# Patient Record
Sex: Female | Born: 1955 | Race: Black or African American | Hispanic: No | Smoking: Never smoker
Health system: Southern US, Community
[De-identification: ages and names within clinical notes are randomized; demographics above are authoritative.]

## PROBLEM LIST (undated history)

## (undated) DIAGNOSIS — I10 Essential (primary) hypertension: Secondary | ICD-10-CM

## (undated) DIAGNOSIS — C801 Malignant (primary) neoplasm, unspecified: Secondary | ICD-10-CM

## (undated) DIAGNOSIS — E119 Type 2 diabetes mellitus without complications: Secondary | ICD-10-CM

## (undated) HISTORY — PX: MASTECTOMY: SHX3

## (undated) HISTORY — PX: CYST EXCISION: SHX5701

---

## 2010-05-29 ENCOUNTER — Ambulatory Visit: Payer: Self-pay | Admitting: Radiology

## 2010-05-29 ENCOUNTER — Ambulatory Visit (HOSPITAL_BASED_OUTPATIENT_CLINIC_OR_DEPARTMENT_OTHER): Admission: RE | Admit: 2010-05-29 | Discharge: 2010-05-29 | Payer: Self-pay | Admitting: Unknown Physician Specialty

## 2012-09-07 ENCOUNTER — Emergency Department (HOSPITAL_BASED_OUTPATIENT_CLINIC_OR_DEPARTMENT_OTHER)
Admission: EM | Admit: 2012-09-07 | Discharge: 2012-09-07 | Disposition: A | Discharge status: Home / Self Care | Payer: Self-pay | Attending: Emergency Medicine | Admitting: Emergency Medicine

## 2012-09-07 ENCOUNTER — Encounter (HOSPITAL_BASED_OUTPATIENT_CLINIC_OR_DEPARTMENT_OTHER): Payer: Self-pay | Admitting: Emergency Medicine

## 2012-09-07 DIAGNOSIS — I10 Essential (primary) hypertension: Secondary | ICD-10-CM | POA: Insufficient documentation

## 2012-09-07 DIAGNOSIS — E119 Type 2 diabetes mellitus without complications: Secondary | ICD-10-CM | POA: Insufficient documentation

## 2012-09-07 DIAGNOSIS — Z791 Long term (current) use of non-steroidal anti-inflammatories (NSAID): Secondary | ICD-10-CM | POA: Insufficient documentation

## 2012-09-07 DIAGNOSIS — Z79899 Other long term (current) drug therapy: Secondary | ICD-10-CM | POA: Insufficient documentation

## 2012-09-07 DIAGNOSIS — M26629 Arthralgia of temporomandibular joint, unspecified side: Secondary | ICD-10-CM | POA: Insufficient documentation

## 2012-09-07 HISTORY — DX: Type 2 diabetes mellitus without complications: E11.9

## 2012-09-07 HISTORY — DX: Essential (primary) hypertension: I10

## 2012-09-07 MED ORDER — NAPROXEN 500 MG PO TABS: 500.0000 mg | ORAL_TABLET | Freq: Two times a day (BID) | ORAL | Status: DC

## 2012-09-07 MED ORDER — NAPROXEN 250 MG PO TABS
500.0000 mg | ORAL_TABLET | Freq: Once | ORAL | Status: AC
  Administered 2012-09-07: 500 mg via ORAL
  Filled 2012-09-07: qty 2

## 2012-09-07 NOTE — ED Notes (Signed)
Pt reports swelling to left posterior jaw on Saturday, denies pain except when chewing, no sob, no difficulty swallowing

## 2012-09-07 NOTE — ED Provider Notes (Signed)
History    CSN: [id number] Arrival date & time 09/07/12  2132 First MD Initiated Contact with Patient 09/07/12 2158      Chief Complaint  Patient presents with  . Jaw Pain   HPI Patient started having pain behind her left jaw on Saturday. The pain is mild but increases with chewing. She has not had any earache or toothache. She denies any sore throat or difficulty breathing. She thought she felt some swelling as well in the left TMJ region. She came to the emergency room because the symptoms persisted. She was trying to get a time to see if it would go away on its own. Past Medical History  Diagnosis Date  . Hypertension   . Diabetes mellitus without complication     History reviewed. No pertinent past surgical history.  History reviewed. No pertinent family history.  History  Substance Use Topics  . Smoking status: Not on file  . Smokeless tobacco: Not on file  . Alcohol Use: No    OB History    Grav Para Term Preterm Abortions TAB SAB Ect Mult Living                  Review of Systems  Allergies  Review of patient's allergies indicates no known allergies.  Home Medications   Current Outpatient Rx  Name  Route  Sig  Dispense  Refill  . GLIPIZIDE 10 MG PO TABS   Oral   Take 10 mg by mouth 2 (two) times daily before a meal.         . HYDROCHLOROTHIAZIDE 25 MG PO TABS   Oral   Take 25 mg by mouth daily.         Marland Kitchen LISINOPRIL 40 MG PO TABS   Oral   Take 40 mg by mouth daily.         Marland Kitchen METFORMIN HCL 500 MG PO TABS   Oral   Take 500 mg by mouth 2 (two) times daily with a meal.         . METOPROLOL SUCCINATE ER 50 MG PO TB24   Oral   Take 50 mg by mouth daily. Take with or immediately following a meal.         . NAPROXEN 500 MG PO TABS   Oral   Take 1 tablet (500 mg total) by mouth 2 (two) times daily.   30 tablet   0     BP 173/74  Pulse 70  Temp 98.5 F (36.9 C) (Oral)  Resp 18  Ht 5\' 3"  (1.6 m)  Wt 190 lb (86.183 kg)  BMI 33.66 kg/m2   SpO2 97%  Physical Exam  Nursing note and vitals reviewed. Constitutional: She appears well-developed and well-nourished. No distress.  HENT:  Head: Normocephalic and atraumatic. No trismus in the jaw.  Right Ear: External ear normal.  Left Ear: External ear normal.  Mouth/Throat: Oropharynx is clear and moist. No oral lesions. No dental abscesses, uvula swelling or lacerations. No oropharyngeal exudate.       Absent teeth left posterior mandibular region, no erythema or masses, mild tenderness palpation left TMJ  Eyes: Conjunctivae normal are normal. Right eye exhibits no discharge. Left eye exhibits no discharge. No scleral icterus.  Neck: Neck supple. No tracheal deviation present.  Cardiovascular: Normal rate.   Pulmonary/Chest: Effort normal. No stridor. No respiratory distress.  Musculoskeletal: She exhibits no edema.  Lymphadenopathy:       Head (right side): No submental, no submandibular,  no tonsillar, no preauricular and no posterior auricular adenopathy present.       Head (left side): No submental, no submandibular, no tonsillar, no preauricular and no posterior auricular adenopathy present.    She has no cervical adenopathy.  Neurological: She is alert. Cranial nerve deficit: no gross deficits.  Skin: Skin is warm and dry. No rash noted.  Psychiatric: She has a normal mood and affect.    ED Course  Procedures (including critical care time)  Labs Reviewed - No data to display No results found.   1. TMJ arthralgia       MDM  No sign of infection or mass. 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 I will prescribe Naprosyn and have her followup with a dentist as needed        Celene Kras, MD 09/07/12 2213

## 2012-12-19 ENCOUNTER — Encounter (HOSPITAL_BASED_OUTPATIENT_CLINIC_OR_DEPARTMENT_OTHER): Payer: Self-pay | Admitting: Emergency Medicine

## 2012-12-19 ENCOUNTER — Emergency Department (HOSPITAL_BASED_OUTPATIENT_CLINIC_OR_DEPARTMENT_OTHER)
Admission: EM | Admit: 2012-12-19 | Discharge: 2012-12-19 | Disposition: A | Discharge status: Home / Self Care | Payer: Self-pay | Attending: Emergency Medicine | Admitting: Emergency Medicine

## 2012-12-19 DIAGNOSIS — M5432 Sciatica, left side: Secondary | ICD-10-CM

## 2012-12-19 DIAGNOSIS — M5430 Sciatica, unspecified side: Secondary | ICD-10-CM | POA: Insufficient documentation

## 2012-12-19 DIAGNOSIS — E119 Type 2 diabetes mellitus without complications: Secondary | ICD-10-CM | POA: Insufficient documentation

## 2012-12-19 DIAGNOSIS — Z79899 Other long term (current) drug therapy: Secondary | ICD-10-CM | POA: Insufficient documentation

## 2012-12-19 DIAGNOSIS — I10 Essential (primary) hypertension: Secondary | ICD-10-CM | POA: Insufficient documentation

## 2012-12-19 MED ORDER — TRAMADOL HCL 50 MG PO TABS
50.0000 mg | ORAL_TABLET | Freq: Four times a day (QID) | ORAL | Status: DC | PRN
Start: 2012-12-19 — End: 2014-10-31

## 2012-12-19 MED ORDER — KETOROLAC TROMETHAMINE 60 MG/2ML IM SOLN
60.0000 mg | Freq: Once | INTRAMUSCULAR | Status: AC
  Administered 2012-12-19: 60 mg via INTRAMUSCULAR
  Filled 2012-12-19: qty 2

## 2012-12-19 MED ORDER — CYCLOBENZAPRINE HCL 10 MG PO TABS: 10.0000 mg | ORAL_TABLET | Freq: Two times a day (BID) | ORAL | Status: DC | PRN

## 2012-12-19 NOTE — ED Notes (Signed)
Pt states lower left back pain started about 1 week ago, with no known injury, radiating down left leg.

## 2012-12-19 NOTE — ED Provider Notes (Signed)
History     CSN: [id number]  Arrival date & time 12/19/12  4098   First MD Initiated Contact with Patient 12/19/12 1002      Chief Complaint  Patient presents with  . Back Pain    (Consider location/radiation/quality/duration/timing/severity/associated sxs/prior treatment) HPI Comments: Patient comes to the ER for evaluation of low back pain. Pain began about one week ago. It is sharp and located in the left lower back. She reports that it is much worse if she bends over. She can find some positions are more comfortable and others. Pain has begun to radiate down the back or leg to the knee area. This is worse with bending as well. Patient denies any recent injury. She had a similar episode approximately 20 years ago, but has never had a problem with it until this week. Patient has not had any change in bowel or bladder function.  Patient is a 57 y.o. female presenting with back pain.  Back Pain   Past Medical History  Diagnosis Date  . Hypertension   . Diabetes mellitus without complication     History reviewed. No pertinent past surgical history.  History reviewed. No pertinent family history.  History  Substance Use Topics  . Smoking status: Not on file  . Smokeless tobacco: Not on file  . Alcohol Use: No    OB History   Grav Para Term Preterm Abortions TAB SAB Ect Mult Living                  Review of Systems  Genitourinary: Negative.   Musculoskeletal: Positive for back pain.    Allergies  Review of patient's allergies indicates no known allergies.  Home Medications   Current Outpatient Rx  Name  Route  Sig  Dispense  Refill  . glipiZIDE (GLUCOTROL) 10 MG tablet   Oral   Take 5 mg by mouth daily.          . hydrochlorothiazide (HYDRODIURIL) 25 MG tablet   Oral   Take 25 mg by mouth daily.         Marland Kitchen lisinopril (PRINIVIL,ZESTRIL) 40 MG tablet   Oral   Take 40 mg by mouth daily.         . metFORMIN (GLUCOPHAGE) 500 MG tablet   Oral   Take  1,000 mg by mouth 2 (two) times daily with a meal.          . metoprolol succinate (TOPROL-XL) 50 MG 24 hr tablet   Oral   Take 200 mg by mouth daily. Take with or immediately following a meal.         . naproxen (NAPROSYN) 500 MG tablet   Oral   Take 1 tablet (500 mg total) by mouth 2 (two) times daily.   30 tablet   0     BP 183/93  Pulse 60  Temp(Src) 98.5 F (36.9 C) (Oral)  Resp 16  Ht 5\' 3"  (1.6 m)  Wt 186 lb (84.369 kg)  BMI 32.96 kg/m2  SpO2 100%  Physical Exam  Constitutional: She appears well-developed.  HENT:  Head: Normocephalic.  Eyes: Pupils are equal, round, and reactive to light.  Neck: Normal range of motion.  Abdominal: Soft.  Musculoskeletal:       Cervical back: Normal.       Thoracic back: Normal.  Left SI joint area tenderness without midline tenderness  Neurological: She has normal strength. No cranial nerve deficit or sensory deficit. She exhibits normal muscle tone.  Reflex Scores:      Patellar reflexes are 2+ on the right side and 2+ on the left side. Skin: Skin is warm, dry and intact.    ED Course  Procedures (including critical care time)  Labs Reviewed - No data to display No results found.   No diagnosis found.    MDM  Patient presents to the ER with musculoskeletal back pain. Examination reveals back tenderness without any associated neurologic findings. Patient's strength, sensation and reflexes were normal. As such, patient did not require any imaging or further studies. Patient was treated with analgesia.  Symptoms are consistent with acute sciatica. I did discuss the possibility of prednisone use, but as the patient is a diabetic, will withhold that and attempt analgesia, NSAIDs and rest. Followup as needed.        Gilda Crease, MD 12/19/12 1005

## 2014-10-30 ENCOUNTER — Observation Stay (HOSPITAL_BASED_OUTPATIENT_CLINIC_OR_DEPARTMENT_OTHER)
Admission: EM | Admit: 2014-10-30 | Discharge: 2014-10-31 | Disposition: A | Discharge status: Home / Self Care | Payer: Self-pay | Attending: Family Medicine | Admitting: Family Medicine

## 2014-10-30 ENCOUNTER — Emergency Department (HOSPITAL_BASED_OUTPATIENT_CLINIC_OR_DEPARTMENT_OTHER): Payer: Self-pay

## 2014-10-30 ENCOUNTER — Encounter (HOSPITAL_BASED_OUTPATIENT_CLINIC_OR_DEPARTMENT_OTHER): Payer: Self-pay | Admitting: *Deleted

## 2014-10-30 DIAGNOSIS — R079 Chest pain, unspecified: Principal | ICD-10-CM | POA: Insufficient documentation

## 2014-10-30 DIAGNOSIS — E119 Type 2 diabetes mellitus without complications: Secondary | ICD-10-CM | POA: Insufficient documentation

## 2014-10-30 DIAGNOSIS — E0590 Thyrotoxicosis, unspecified without thyrotoxic crisis or storm: Secondary | ICD-10-CM | POA: Diagnosis present

## 2014-10-30 DIAGNOSIS — E079 Disorder of thyroid, unspecified: Secondary | ICD-10-CM | POA: Insufficient documentation

## 2014-10-30 DIAGNOSIS — I10 Essential (primary) hypertension: Secondary | ICD-10-CM | POA: Insufficient documentation

## 2014-10-30 LAB — BASIC METABOLIC PANEL
Anion gap: 9 (ref 5–15)
BUN: 20 mg/dL (ref 6–23)
CO2: 30 mmol/L (ref 19–32)
Calcium: 9.7 mg/dL (ref 8.4–10.5)
Chloride: 102 mmol/L (ref 96–112)
Creatinine, Ser: 1 mg/dL (ref 0.50–1.10)
GFR calc Af Amer: 70 mL/min — ABNORMAL LOW (ref >90)
GFR, EST NON AFRICAN AMERICAN: 60 mL/min — AB (ref >90)
GLUCOSE: 132 mg/dL — AB (ref 70–99)
POTASSIUM: 3.6 mmol/L (ref 3.5–5.1)
SODIUM: 141 mmol/L (ref 135–145)

## 2014-10-30 LAB — CBC
HCT: 38.4 % (ref 36.0–46.0)
Hemoglobin: 12.4 g/dL (ref 12.0–15.0)
MCH: 27.1 pg (ref 26.0–34.0)
MCHC: 32.3 g/dL (ref 30.0–36.0)
MCV: 84 fL (ref 78.0–100.0)
PLATELETS: 228 10*3/uL (ref 150–400)
RBC: 4.57 MIL/uL (ref 3.87–5.11)
RDW: 15.1 % (ref 11.5–15.5)
WBC: 8.4 10*3/uL (ref 4.0–10.5)

## 2014-10-30 LAB — TROPONIN I

## 2014-10-30 MED ORDER — ONDANSETRON HCL 4 MG/2ML IJ SOLN
4.0000 mg | INTRAMUSCULAR | Status: AC
  Administered 2014-10-30: 4 mg via INTRAVENOUS
  Filled 2014-10-30: qty 2

## 2014-10-30 MED ORDER — MORPHINE SULFATE 4 MG/ML IJ SOLN
4.0000 mg | Freq: Once | INTRAMUSCULAR | Status: AC
  Administered 2014-10-30: 4 mg via INTRAVENOUS
  Filled 2014-10-30: qty 1

## 2014-10-30 MED ORDER — ASPIRIN 81 MG PO CHEW
324.0000 mg | CHEWABLE_TABLET | Freq: Once | ORAL | Status: AC
  Administered 2014-10-30: 324 mg via ORAL
  Filled 2014-10-30: qty 4

## 2014-10-30 NOTE — Progress Notes (Signed)
Cp r/o required per ed,  Trop negative.

## 2014-10-30 NOTE — ED Provider Notes (Signed)
Medical screening examination/treatment/procedure(s) were conducted as a shared visit with non-physician practitioner(s) and myself.  I personally evaluated the patient during the encounter.   EKG Interpretation   Date/Time:  Monday October 30 2014 19:46:21 EDT Ventricular Rate:  69 PR Interval:  164 QRS Duration: 96 QT Interval:  438 QTC Calculation: 469 R Axis:   64 Text Interpretation:  Normal sinus rhythm Possible Left atrial enlargement  Incomplete right bundle branch block Nonspecific T wave abnormality  Prolonged QT Abnormal ECG No old tracing to compare Confirmed by WARD,   DO, KRISTEN ([id number]) on 10/30/2014 7:53:19 PM      Pt is a 59 y.o. female with history of hypertension, diabetes who presents the emergency department with 2 days of intermittent chest tightness without radiation, shortness of breath. She is not sure if it is exertional. No associated nausea, vomiting, diaphoresis or dizziness. No history of provocative testing. EKG shows T wave inversions in anterior leads with no old for comparison.  Troponin negative. Patient chest pain-free after aspirin and morphine. Admitted for telemetry, observation for chest pain rule out by Dr. Maudie Mercury.  [place], DO 10/30/14 2234

## 2014-10-30 NOTE — ED Notes (Signed)
Chest tightness yesterday. Today after lunch the tightness returned.

## 2014-10-30 NOTE — ED Provider Notes (Signed)
CSN: [id number]     Arrival date & time 10/30/14  1936 History  This chart was scribed for non-physician practitioner working with Patrick, DO by Molli Posey, ED Scribe. This patient was seen in room MH05/MH05 and the patient's care was started at 7:54 PM.    Chief Complaint  Patient presents with  . Chest Pain   The history is provided by the patient. No language interpreter was used.   HPI Comments: Julia Sexton is a 59 y.o. female with a history of HTN, DM and thyroid disease who presents to the Emergency Department complaining of intermittent, dull CP that started today after lunch. She states that her pain is in the center of her chest and did not radiate to any other locations. She rates her pain as 4/10 at this time. Pt states that she felt normal last night when she went to sleep and this morning when she woke up. She states that she lowered the dosage of her thyroid medication from 3 tablets to 1 tablet a week ago and states that her CP seemed to improve after lowering the dosage. Pt reports she first started experiencing CP 3 weeks ago and she then started changing the dosage of her thyroid medication and states she discontinued it for 1 week in early March. She states that she has been taking 1 tablet daily for the last week. She states that she was started on the thyroid medication 08/31/14 and says the last time she had her blood work done was today but she does not know the results. Pt reports no history of smoking. She reports no history of MI or CVA. Pt reports no family history of CAD, CHF or sudden deaths prior to age 75. Pt reports no alleviating or exacerbating factors. She denies SOB, nausea, vomiting, diaphoresis, palpitations, fevers, chills, dysuria and abdominal pain.   Past Medical History  Diagnosis Date  . Hypertension   . Diabetes mellitus without complication   . Thyroid disease    History reviewed. No pertinent past surgical history. No family history on  file. History  Substance Use Topics  . Smoking status: Never Smoker   . Smokeless tobacco: Not on file  . Alcohol Use: No   OB History    No data available     Review of Systems  Constitutional: Negative for fever and chills.  HENT: Negative for sore throat.   Eyes: Negative for visual disturbance.  Respiratory: Positive for chest tightness and shortness of breath. Negative for cough.   Cardiovascular: Positive for chest pain. Negative for leg swelling.  Gastrointestinal: Negative for nausea, vomiting, abdominal pain and diarrhea.  Genitourinary: Negative for dysuria.  Musculoskeletal: Negative for myalgias.  Skin: Negative for rash.  Neurological: Negative for weakness, numbness and headaches.  All other systems reviewed and are negative.   Allergies  Review of patient's allergies indicates no known allergies.  Home Medications   Prior to Admission medications   Medication Sig Start Date End Date Taking? Authorizing Provider  methimazole (TAPAZOLE) 5 MG tablet Take 5 mg by mouth 3 (three) times daily.   Yes Historical Provider, MD  cyclobenzaprine (FLEXERIL) 10 MG tablet Take 1 tablet (10 mg total) by mouth 2 (two) times daily as needed for muscle spasms. 12/19/12   Orpah Greek, MD  glipiZIDE (GLUCOTROL) 10 MG tablet Take 5 mg by mouth daily.     Historical Provider, MD  hydrochlorothiazide (HYDRODIURIL) 25 MG tablet Take 25 mg by mouth daily.  Historical Provider, MD  lisinopril (PRINIVIL,ZESTRIL) 40 MG tablet Take 40 mg by mouth daily.    Historical Provider, MD  metFORMIN (GLUCOPHAGE) 500 MG tablet Take 1,000 mg by mouth 2 (two) times daily with a meal.     Historical Provider, MD  metoprolol succinate (TOPROL-XL) 50 MG 24 hr tablet Take 200 mg by mouth daily. Take with or immediately following a meal.    Historical Provider, MD  naproxen (NAPROSYN) 500 MG tablet Take 1 tablet (500 mg total) by mouth 2 (two) times daily. 09/07/12   Dorie Rank, MD  traMADol (ULTRAM)  50 MG tablet Take 1 tablet (50 mg total) by mouth every 6 (six) hours as needed for pain. 12/19/12   Orpah Greek, MD   BP 184/92 mmHg  Pulse 66  Temp(Src) 99.1 F (37.3 C) (Oral)  Resp 20  Ht 5\' 5"  (1.651 m)  Wt 198 lb (89.812 kg)  BMI 32.95 kg/m2  SpO2 98% Physical Exam  Constitutional: She is oriented to person, place, and time. She appears well-developed and well-nourished. No distress.  HENT:  Head: Normocephalic and atraumatic.  Mouth/Throat: Oropharynx is clear and moist.  Eyes: Conjunctivae are normal. Right eye exhibits no discharge. Left eye exhibits no discharge.  Neck: Neck supple. No JVD present. No tracheal deviation present.  Cardiovascular: Normal rate, regular rhythm, normal heart sounds and intact distal pulses.  Exam reveals no gallop and no friction rub.   No murmur heard. Pulmonary/Chest: Effort normal and breath sounds normal. No respiratory distress. She has no wheezes. She has no rales. She exhibits no tenderness.  Abdominal: Soft. She exhibits no distension. There is no tenderness.  Musculoskeletal: She exhibits no tenderness.  Lymphadenopathy:    She has no cervical adenopathy.  Neurological: She is alert and oriented to person, place, and time.  Skin: Skin is warm and dry. No rash noted. She is not diaphoretic.  Psychiatric: She has a normal mood and affect.  Nursing note and vitals reviewed.   ED Course  Procedures   DIAGNOSTIC STUDIES: Oxygen Saturation is 98% on RA, normal by my interpretation.    COORDINATION OF CARE: 8:06 PM Discussed treatment plan with pt at bedside and pt agreed to plan.  Labs Review Labs Reviewed  BASIC METABOLIC PANEL - Abnormal; Notable for the following:    Glucose, Bld 132 (*)    GFR calc non Af Amer 60 (*)    GFR calc Af Amer 70 (*)    All other components within normal limits  CBC  TROPONIN I    Imaging Review Dg Chest 2 View  10/30/2014   CLINICAL DATA:  Intermittent chest heaviness, worsening  yesterday. Cough.  EXAM: CHEST  2 VIEW  COMPARISON:  None.  FINDINGS: The heart is mildly enlarged. Mediastinal shadows are unremarkable. The lungs are clear. The vascularity is normal. No effusions. No bony abnormalities.  IMPRESSION: Borderline cardiomegaly.  No active disease.   Electronically Signed   By: Nelson Chimes M.D.   On: 10/30/2014 21:34     EKG Interpretation   Date/Time:  Monday October 30 2014 19:46:21 EDT Ventricular Rate:  69 PR Interval:  164 QRS Duration: 96 QT Interval:  438 QTC Calculation: 469 R Axis:   64 Text Interpretation:  Normal sinus rhythm Possible Left atrial enlargement  Incomplete right bundle branch block Nonspecific T wave abnormality  Prolonged QT Abnormal ECG No old tracing to compare Confirmed by WARD,   DO, KRISTEN ([id number]) on 10/30/2014 7:53:19 PM  MDM   Final diagnoses:  Chest pain, unspecified chest pain type   59 yo female presenting with chest pressure and shortness of breath x 1 day.  Heart score is 5 based on moderately suspicious history, non-specific T-wave abnormalities on EKG, age and 3 risk factors.  Consider cardiac etiology of chest pain. CBC, BMP, Troponin, CXR and ASA, morphine IV.  Case discussed with Dr. Leonides Schanz.  Labs and imaging reviewed and no significant abnormalities except borderline cardiomegaly noted on CXR.  Consulted Dr.  Maudie Mercury (Hospitalist) for transfer to [facility] for CP rule-out. Pt accepted to telemetry/observation bed.  EMTALA form done by Dr. Leonides Schanz.  Pt updated with plan and is agreement.    I personally performed the services described in this documentation, which was scribed in my presence. The recorded information has been reviewed and is accurate.  Filed Vitals:   10/30/14 1946 10/30/14 2055  BP: 184/92 175/80  Pulse: 66 63  Temp: 99.1 F (37.3 C)   TempSrc: Oral   Resp: 20 15  Height: 5\' 5"  (1.651 m)   Weight: 198 lb (89.812 kg)   SpO2: 98% 98%   Meds given in ED:  Medications  morphine 4 MG/ML  injection 4 mg (not administered)  ondansetron (ZOFRAN) injection 4 mg (not administered)  aspirin chewable tablet 324 mg (324 mg Oral Given 10/30/14 2049)    New Prescriptions   No medications on file        Britt Bottom, NP 10/31/14 [place], DO 10/31/14 2321

## 2014-10-31 ENCOUNTER — Other Ambulatory Visit: Payer: Self-pay | Admitting: Physician Assistant

## 2014-10-31 ENCOUNTER — Encounter (HOSPITAL_COMMUNITY): Payer: Self-pay | Admitting: Internal Medicine

## 2014-10-31 DIAGNOSIS — R0789 Other chest pain: Secondary | ICD-10-CM

## 2014-10-31 DIAGNOSIS — I10 Essential (primary) hypertension: Secondary | ICD-10-CM | POA: Diagnosis present

## 2014-10-31 DIAGNOSIS — E119 Type 2 diabetes mellitus without complications: Secondary | ICD-10-CM

## 2014-10-31 DIAGNOSIS — R079 Chest pain, unspecified: Secondary | ICD-10-CM

## 2014-10-31 DIAGNOSIS — E0590 Thyrotoxicosis, unspecified without thyrotoxic crisis or storm: Secondary | ICD-10-CM

## 2014-10-31 DIAGNOSIS — E118 Type 2 diabetes mellitus with unspecified complications: Secondary | ICD-10-CM

## 2014-10-31 LAB — COMPREHENSIVE METABOLIC PANEL
ALT: 18 U/L (ref 0–35)
ANION GAP: 8 (ref 5–15)
AST: 22 U/L (ref 0–37)
Albumin: 3.3 g/dL — ABNORMAL LOW (ref 3.5–5.2)
Alkaline Phosphatase: 90 U/L (ref 39–117)
BILIRUBIN TOTAL: 0.9 mg/dL (ref 0.3–1.2)
BUN: 14 mg/dL (ref 6–23)
CO2: 29 mmol/L (ref 19–32)
CREATININE: 0.93 mg/dL (ref 0.50–1.10)
Calcium: 9.5 mg/dL (ref 8.4–10.5)
Chloride: 102 mmol/L (ref 96–112)
GFR calc Af Amer: 76 mL/min — ABNORMAL LOW (ref >90)
GFR, EST NON AFRICAN AMERICAN: 66 mL/min — AB (ref >90)
GLUCOSE: 115 mg/dL — AB (ref 70–99)
Potassium: 4.1 mmol/L (ref 3.5–5.1)
Sodium: 139 mmol/L (ref 135–145)
Total Protein: 6.8 g/dL (ref 6.0–8.3)

## 2014-10-31 LAB — PROTIME-INR
INR: 0.97 (ref 0.00–1.49)
Prothrombin Time: 13 seconds (ref 11.6–15.2)

## 2014-10-31 LAB — CBC
HEMATOCRIT: 37 % (ref 36.0–46.0)
HEMOGLOBIN: 12 g/dL (ref 12.0–15.0)
MCH: 27.3 pg (ref 26.0–34.0)
MCHC: 32.4 g/dL (ref 30.0–36.0)
MCV: 84.3 fL (ref 78.0–100.0)
Platelets: 229 10*3/uL (ref 150–400)
RBC: 4.39 MIL/uL (ref 3.87–5.11)
RDW: 15.1 % (ref 11.5–15.5)
WBC: 7.5 10*3/uL (ref 4.0–10.5)

## 2014-10-31 LAB — T4, FREE: FREE T4: 1.39 ng/dL (ref 0.80–1.80)

## 2014-10-31 LAB — TSH: TSH: 0.033 u[IU]/mL — ABNORMAL LOW (ref 0.350–4.500)

## 2014-10-31 LAB — GLUCOSE, CAPILLARY
GLUCOSE-CAPILLARY: 104 mg/dL — AB (ref 70–99)
GLUCOSE-CAPILLARY: 113 mg/dL — AB (ref 70–99)

## 2014-10-31 LAB — TROPONIN I: Troponin I: 0.03 ng/mL (ref <0.031)

## 2014-10-31 MED ORDER — ONDANSETRON HCL 4 MG/2ML IJ SOLN: 4.0000 mg | Freq: Four times a day (QID) | INTRAMUSCULAR | Status: DC | PRN

## 2014-10-31 MED ORDER — ACETAMINOPHEN 650 MG RE SUPP: 650.0000 mg | Freq: Four times a day (QID) | RECTAL | Status: DC | PRN

## 2014-10-31 MED ORDER — ENOXAPARIN SODIUM 40 MG/0.4ML ~~LOC~~ SOLN: 40.0000 mg | SUBCUTANEOUS | Status: DC

## 2014-10-31 MED ORDER — SODIUM CHLORIDE 0.9 % IJ SOLN
3.0000 mL | Freq: Two times a day (BID) | INTRAMUSCULAR | Status: DC
  Administered 2014-10-31: 3 mL via INTRAVENOUS

## 2014-10-31 MED ORDER — LISINOPRIL 40 MG PO TABS
40.0000 mg | ORAL_TABLET | Freq: Every day | ORAL | Status: DC
  Administered 2014-10-31: 40 mg via ORAL
  Filled 2014-10-31 (×2): qty 1

## 2014-10-31 MED ORDER — ONDANSETRON HCL 4 MG PO TABS: 4.0000 mg | ORAL_TABLET | Freq: Four times a day (QID) | ORAL | Status: DC | PRN

## 2014-10-31 MED ORDER — METOPROLOL SUCCINATE ER 100 MG PO TB24
200.0000 mg | ORAL_TABLET | Freq: Every day | ORAL | Status: DC
  Administered 2014-10-31: 200 mg via ORAL
  Filled 2014-10-31: qty 2

## 2014-10-31 MED ORDER — METHIMAZOLE 5 MG PO TABS: 5.0000 mg | ORAL_TABLET | Freq: Three times a day (TID) | ORAL | Status: DC

## 2014-10-31 MED ORDER — INSULIN ASPART 100 UNIT/ML ~~LOC~~ SOLN: 0.0000 [IU] | Freq: Four times a day (QID) | SUBCUTANEOUS | Status: DC

## 2014-10-31 MED ORDER — ACETAMINOPHEN 325 MG PO TABS: 650.0000 mg | ORAL_TABLET | Freq: Four times a day (QID) | ORAL | Status: DC | PRN

## 2014-10-31 NOTE — Progress Notes (Signed)
Patient has met adequate criteria for discharge per MD order. Patient discharge summary was printed and given to patient. All required education, follow-up appointments, and new medication were gone over with patient & family. All hospital equipment and invasive lines were removed. Patient left the hospital with patient belongings in hand escorted by floor medical tech via wheelchair.

## 2014-10-31 NOTE — Progress Notes (Signed)
Physician Discharge Summary  Julia Sexton RWE:[id number] DOB: 07-06-1956 DOA: 10/30/2014  PCP: Lowanda Foster, MD  Admit date: 10/30/2014 Discharge date: 10/31/2014  Time spent: 25* minutes  Recommendations for Outpatient Follow-up:  1. *Follow up PCP in 2 weeks  Discharge Diagnoses:  Principal Problem:   Chest pain Active Problems:   Essential hypertension   Hyperthyroidism   Type 2 diabetes mellitus   Discharge Condition: Stable  Diet recommendation: Low salt diet  Filed Weights   10/30/14 1946 10/31/14 0037  Weight: 89.812 kg (198 lb) 90.447 kg (199 lb 6.4 oz)    History of present illness:  59 y.o. female with Past medical history of hypertension, diabetes mellitus, hyperthyroidism. The patient is presenting with complaints of chest pain. The pain is located centrally and was like a pressure as well as dull ache. The pain is not radiating. Patient has been having this pain since last one week. The patient has started taking methimazole since last 2 months and does mentions that since last one week she has been having on and off chest pain. She stopped taking the methimazole and the pain resolved after that. And she restarted 2 days ago and the pain reoccurred after that.  Hospital Course:   Chest pain- resolved cardiac enzymes 3 negative, patient was seen by cardiology and outpatient stress test has been scheduled. Patient was taking methimazole which was causing her pain when she stopped taking methimazole the pain went away and recurred after she started taking again. At this time I'm going to hold the methimazole.  Hyperthyroidism- patient's TSH 0.033 with T4 1.39 despite being on methimazole. She will need ultrasound of thyroid as outpatient. And follow-up with the PCP for possible endocrine referral.  Diabetes mellitus- blood glucose controlled, continue taking  Glucotrol and metformin.  Hypertension- continue HCTZ, metoprolol,  lisinopril.  Procedures:  None  Consultations:  Cardiology  Discharge Exam: Filed Vitals:   10/31/14 0500  BP: 140/65  Pulse: 56  Temp: 98.1 F (36.7 C)  Resp: 16    General: Appears in no acute distress Cardiovascular: S1-S2 regular Respiratory: Clear to auscultation bilaterally  Discharge Instructions   Discharge Instructions    Diet - low sodium heart healthy    Complete by:  As directed      Increase activity slowly    Complete by:  As directed           Current Discharge Medication List    CONTINUE these medications which have NOT CHANGED   Details  glipiZIDE (GLUCOTROL) 10 MG tablet Take 10 mg by mouth daily.     hydrochlorothiazide (HYDRODIURIL) 25 MG tablet Take 25 mg by mouth daily.    lisinopril (PRINIVIL,ZESTRIL) 40 MG tablet Take 40 mg by mouth daily.    metFORMIN (GLUCOPHAGE) 500 MG tablet Take 1,000 mg by mouth 2 (two) times daily with a meal.     metoprolol succinate (TOPROL-XL) 50 MG 24 hr tablet Take 200 mg by mouth daily. Take with or immediately following a meal.    naproxen (NAPROSYN) 500 MG tablet Take 1 tablet (500 mg total) by mouth 2 (two) times daily. Qty: 30 tablet, Refills: 0      STOP taking these medications     methimazole (TAPAZOLE) 5 MG tablet        No Known Allergies Follow-up Information    Follow up with [facility].   Specialty:  Cardiology   Why:  Stress test 11/09/14 at 8am - nurse will call you  with instructions   Contact information:   [address] [place] [phone]      Follow up with Pixie Casino, MD.   Specialty:  Cardiology   Why:  11/27/14 at 11:30am   Contact information:   [place] [address] [phone]        The results of significant diagnostics from this hospitalization (including imaging, microbiology, ancillary and laboratory) are listed below for reference.    Significant Diagnostic  Studies: Dg Chest 2 View  10/30/2014   CLINICAL DATA:  Intermittent chest heaviness, worsening yesterday. Cough.  EXAM: CHEST  2 VIEW  COMPARISON:  None.  FINDINGS: The heart is mildly enlarged. Mediastinal shadows are unremarkable. The lungs are clear. The vascularity is normal. No effusions. No bony abnormalities.  IMPRESSION: Borderline cardiomegaly.  No active disease.   Electronically Signed   By: Nelson Chimes M.D.   On: 10/30/2014 21:34    Microbiology: No results found for this or any previous visit (from the past 240 hour(s)).   Labs: Basic Metabolic Panel:  Recent Labs Lab 10/30/14 2003 10/31/14 0818  NA 141 139  K 3.6 4.1  CL 102 102  CO2 30 29  GLUCOSE 132* 115*  BUN 20 14  CREATININE 1.00 0.93  CALCIUM 9.7 9.5   Liver Function Tests:  Recent Labs Lab 10/31/14 0818  AST 22  ALT 18  ALKPHOS 90  BILITOT 0.9  PROT 6.8  ALBUMIN 3.3*   No results for input(s): LIPASE, AMYLASE in the last 168 hours. No results for input(s): AMMONIA in the last 168 hours. CBC:  Recent Labs Lab 10/30/14 2003 10/31/14 0818  WBC 8.4 7.5  HGB 12.4 12.0  HCT 38.4 37.0  MCV 84.0 84.3  PLT 228 229   Cardiac Enzymes:  Recent Labs Lab 10/30/14 2003 10/31/14 0417 10/31/14 0818  TROPONINI <0.03 0.03 <0.03   BNP: BNP (last 3 results) No results for input(s): BNP in the last 8760 hours.  ProBNP (last 3 results) No results for input(s): PROBNP in the last 8760 hours.  CBG:  Recent Labs Lab 10/31/14 0335 10/31/14 0931  GLUCAP 104* 113*       Signed:  Mcclain Shall S  [facility] 10/31/2014, 12:27 PM

## 2014-10-31 NOTE — Consult Note (Signed)
CONSULTATION NOTE  Reason for Consult: Chest pain  Requesting Physician: Dr. Sharl Ma  Cardiologist: None (NEW)  HPI: This is a 59 y.o. female with a past medical history significant for hypertension, type 2 diabetes and hyperthyroidism. TSH is noted to be 0.33 during this admission. She has been taking Tapazole for the last 2 months. She reports having on and off chest pain with the medicine that seemed to improve after stopping it but then when she restarted the medicine her symptoms recurred. She reports no worsening shortness of breath with exertion, chest pain with exertion, nausea vomiting or GERD symptoms. EKG shows right bundle branch block with anteroseptal T-wave inversions and probable left atrial enlargement. Chest x-ray shows borderline cardiomegaly otherwise no significant findings. Troponin overnight is been negative 2. Cardiology is asked to evaluate regarding chest pain.  PMHx:  Past Medical History  Diagnosis Date  . Hypertension   . Diabetes mellitus without complication   . Thyroid disease    History reviewed. No pertinent past surgical history.  FAMHx: Family History  Problem Relation Age of Onset  . Kidney disease Father     deceased  . Diabetes Mother     deceased    SOCHx:  reports that she has never smoked. She does not have any smokeless tobacco history on file. She reports that she does not drink alcohol. Her drug history is not on file.  ALLERGIES: No Known Allergies  ROS: A comprehensive review of systems was negative except for: Cardiovascular: positive for chest pressure/discomfort Endocrine: positive for hyperthyroidism  HOME MEDICATIONS: Prescriptions prior to admission  Medication Sig Dispense Refill Last Dose  . methimazole (TAPAZOLE) 5 MG tablet Take 5 mg by mouth 3 (three) times daily.     . cyclobenzaprine (FLEXERIL) 10 MG tablet Take 1 tablet (10 mg total) by mouth 2 (two) times daily as needed for muscle spasms. 20 tablet 0   .  glipiZIDE (GLUCOTROL) 10 MG tablet Take 5 mg by mouth daily.    12/19/2012 at Unknown  . hydrochlorothiazide (HYDRODIURIL) 25 MG tablet Take 25 mg by mouth daily.   12/19/2012 at Unknown  . lisinopril (PRINIVIL,ZESTRIL) 40 MG tablet Take 40 mg by mouth daily.   12/19/2012 at Unknown  . metFORMIN (GLUCOPHAGE) 500 MG tablet Take 1,000 mg by mouth 2 (two) times daily with a meal.    12/19/2012 at Unknown  . metoprolol succinate (TOPROL-XL) 50 MG 24 hr tablet Take 200 mg by mouth daily. Take with or immediately following a meal.   12/19/2012 at Unknown  . naproxen (NAPROSYN) 500 MG tablet Take 1 tablet (500 mg total) by mouth 2 (two) times daily. 30 tablet 0   . traMADol (ULTRAM) 50 MG tablet Take 1 tablet (50 mg total) by mouth every 6 (six) hours as needed for pain. 20 tablet 0     HOSPITAL MEDICATIONS: Prior to Admission:  Prescriptions prior to admission  Medication Sig Dispense Refill Last Dose  . methimazole (TAPAZOLE) 5 MG tablet Take 5 mg by mouth 3 (three) times daily.     . cyclobenzaprine (FLEXERIL) 10 MG tablet Take 1 tablet (10 mg total) by mouth 2 (two) times daily as needed for muscle spasms. 20 tablet 0   . glipiZIDE (GLUCOTROL) 10 MG tablet Take 5 mg by mouth daily.    12/19/2012 at Unknown  . hydrochlorothiazide (HYDRODIURIL) 25 MG tablet Take 25 mg by mouth daily.   12/19/2012 at Unknown  . lisinopril (PRINIVIL,ZESTRIL) 40 MG tablet Take 40  mg by mouth daily.   12/19/2012 at Unknown  . metFORMIN (GLUCOPHAGE) 500 MG tablet Take 1,000 mg by mouth 2 (two) times daily with a meal.    12/19/2012 at Unknown  . metoprolol succinate (TOPROL-XL) 50 MG 24 hr tablet Take 200 mg by mouth daily. Take with or immediately following a meal.   12/19/2012 at Unknown  . naproxen (NAPROSYN) 500 MG tablet Take 1 tablet (500 mg total) by mouth 2 (two) times daily. 30 tablet 0   . traMADol (ULTRAM) 50 MG tablet Take 1 tablet (50 mg total) by mouth every 6 (six) hours as needed for pain. 20 tablet 0      VITALS: Blood pressure 140/65, pulse 56, temperature 98.1 F (36.7 C), temperature source Oral, resp. rate 16, height $RemoveBe'5\' 5"'JKbVuTRml$  (1.651 m), weight 199 lb 6.4 oz (90.447 kg), SpO2 96 %.  PHYSICAL EXAM: General appearance: alert and no distress Neck: no carotid bruit, no JVD and no proptosis Lungs: clear to auscultation bilaterally Heart: regular rate and rhythm, S1, S2 normal, no murmur, click, rub or gallop Abdomen: soft, non-tender; bowel sounds normal; no masses,  no organomegaly Extremities: extremities normal, atraumatic, no cyanosis or edema Pulses: 2+ and symmetric Skin: Skin color, texture, turgor normal. No rashes or lesions Neurologic: Grossly normal Psych: Pleasant  LABS: Results for orders placed or performed during the hospital encounter of 10/30/14 (from the past 48 hour(s))  CBC     Status: None   Collection Time: 10/30/14  8:03 PM  Result Value Ref Range   WBC 8.4 4.0 - 10.5 K/uL   RBC 4.57 3.87 - 5.11 MIL/uL   Hemoglobin 12.4 12.0 - 15.0 g/dL   HCT 38.4 36.0 - 46.0 %   MCV 84.0 78.0 - 100.0 fL   MCH 27.1 26.0 - 34.0 pg   MCHC 32.3 30.0 - 36.0 g/dL   RDW 15.1 11.5 - 15.5 %   Platelets 228 150 - 400 K/uL  Basic metabolic panel     Status: Abnormal   Collection Time: 10/30/14  8:03 PM  Result Value Ref Range   Sodium 141 135 - 145 mmol/L   Potassium 3.6 3.5 - 5.1 mmol/L   Chloride 102 96 - 112 mmol/L   CO2 30 19 - 32 mmol/L   Glucose, Bld 132 (H) 70 - 99 mg/dL   BUN 20 6 - 23 mg/dL   Creatinine, Ser 1.00 0.50 - 1.10 mg/dL   Calcium 9.7 8.4 - 10.5 mg/dL   GFR calc non Af Amer 60 (L) >90 mL/min   GFR calc Af Amer 70 (L) >90 mL/min    Comment: (NOTE) The eGFR has been calculated using the CKD EPI equation. This calculation has not been validated in all clinical situations. eGFR's persistently <90 mL/min signify possible Chronic Kidney Disease.    Anion gap 9 5 - 15  Troponin I (MHP)     Status: None   Collection Time: 10/30/14  8:03 PM  Result Value Ref  Range   Troponin I <0.03 <0.031 ng/mL    Comment:        NO INDICATION OF MYOCARDIAL INJURY.   Glucose, capillary     Status: Abnormal   Collection Time: 10/31/14  3:35 AM  Result Value Ref Range   Glucose-Capillary 104 (H) 70 - 99 mg/dL  Troponin I     Status: None   Collection Time: 10/31/14  4:17 AM  Result Value Ref Range   Troponin I 0.03 <0.031 ng/mL  Comment:        NO INDICATION OF MYOCARDIAL INJURY.   TSH     Status: Abnormal   Collection Time: 10/31/14  4:17 AM  Result Value Ref Range   TSH 0.033 (L) 0.350 - 4.500 uIU/mL    IMAGING: Dg Chest 2 View  10/30/2014   CLINICAL DATA:  Intermittent chest heaviness, worsening yesterday. Cough.  EXAM: CHEST  2 VIEW  COMPARISON:  None.  FINDINGS: The heart is mildly enlarged. Mediastinal shadows are unremarkable. The lungs are clear. The vascularity is normal. No effusions. No bony abnormalities.  IMPRESSION: Borderline cardiomegaly.  No active disease.   Electronically Signed   By: Nelson Chimes M.D.   On: 10/30/2014 21:34    HOSPITAL DIAGNOSES: Principal Problem:   Chest pain Active Problems:   Essential hypertension   Hyperthyroidism   Type 2 diabetes mellitus   IMPRESSION: 1. Atypical rest chest pain 2. Abnormal EKG - iRBBB, left atrial enlargment, anteroseptal TWI's  RECOMMENDATION: 1. Atypical chest pain at rest which is constant and seems to be associated with methimazole. She remains hyperthyroid. She has not seen an endocrinologist. This may be helpful to look at alternative medications and/or further evaluation. She has not had a thyroid ultrasound. Her EKG is abnormal, albeit not specific for ischemia. She's had troponin negative 2. Currently she is chest pain-free. Given her cardiac risk factors and abnormal EKG, I would recommend further cardiac workup at some point. I think we can safely do an outpatient exercise nuclear stress test. She can then follow-up with [place] afterwards in the office.  Thanks for the  consultation.  Time Spent Directly with Patient: 30 minutes  Pixie Casino, MD, [facility] Attending Cardiologist [facility]  Jaleya Pebley C 10/31/2014, 8:01 AM

## 2014-10-31 NOTE — H&P (Signed)
[facility] History and Physical  Patient: Julia Sexton  MRN: [id number]  DOB: 06-Aug-1955  DOS: the patient was seen and examined on 10/31/2014 PCP: Lowanda Foster, MD  Chief Complaint: Chest pain  HPI: Julia Sexton is a 59 y.o. female with Past medical history of hypertension, diabetes mellitus, hyperthyroidism. The patient is presenting with complaints of chest pain. The pain is located centrally and was like a pressure as well as dull ache. The pain is not radiating. Patient has been having this pain since last one week. The patient has started taking methimazole since last 2 months and does mentions that since last one week she has been having on and off chest pain. She stopped taking the methimazole and the pain resolved after that. And she restarted 2 days ago and the pain reoccurred after that. Patient denies any shortness of breath and diaphoresis any leg swelling any recent travel or any recent surgeries immobilization. Patient denies any prior cardiac workup. Patient denies any nausea vomiting or acid reflux.  The patient is coming from home. And at her baseline independent for most of her ADL.  Review of Systems: as mentioned in the history of present illness.  A Comprehensive review of the other systems is negative.  Past Medical History  Diagnosis Date  . Hypertension   . Diabetes mellitus without complication   . Thyroid disease    History reviewed. No pertinent past surgical history. Social History:  reports that she has never smoked. She does not have any smokeless tobacco history on file. She reports that she does not drink alcohol. Her drug history is not on file.  No Known Allergies  No family history on file.  Prior to Admission medications   Medication Sig Start Date End Date Taking? Authorizing Provider  methimazole (TAPAZOLE) 5 MG tablet Take 5 mg by mouth 3 (three) times daily.   Yes Historical Provider, MD  cyclobenzaprine (FLEXERIL) 10  MG tablet Take 1 tablet (10 mg total) by mouth 2 (two) times daily as needed for muscle spasms. 12/19/12   Orpah Greek, MD  glipiZIDE (GLUCOTROL) 10 MG tablet Take 5 mg by mouth daily.     Historical Provider, MD  hydrochlorothiazide (HYDRODIURIL) 25 MG tablet Take 25 mg by mouth daily.    Historical Provider, MD  lisinopril (PRINIVIL,ZESTRIL) 40 MG tablet Take 40 mg by mouth daily.    Historical Provider, MD  metFORMIN (GLUCOPHAGE) 500 MG tablet Take 1,000 mg by mouth 2 (two) times daily with a meal.     Historical Provider, MD  metoprolol succinate (TOPROL-XL) 50 MG 24 hr tablet Take 200 mg by mouth daily. Take with or immediately following a meal.    Historical Provider, MD  naproxen (NAPROSYN) 500 MG tablet Take 1 tablet (500 mg total) by mouth 2 (two) times daily. 09/07/12   Dorie Rank, MD  traMADol (ULTRAM) 50 MG tablet Take 1 tablet (50 mg total) by mouth every 6 (six) hours as needed for pain. 12/19/12   Orpah Greek, MD    Physical Exam: Filed Vitals:   10/30/14 2055 10/30/14 2200 10/30/14 2300 10/31/14 0037  BP: 175/80 162/77 165/88 163/79  Pulse: 63 60 60 60  Temp:    98.5 F (36.9 C)  TempSrc:    Oral  Resp: 15 20 17 16   Height:    5\' 5"  (1.651 m)  Weight:    90.447 kg (199 lb 6.4 oz)  SpO2: 98% 97% 100% 96%  General: Alert, Awake and Oriented to Time, Place and Person. Appear in mild distress Eyes: PERRL ENT: Oral Mucosa clear moist. Neck: no JVD Cardiovascular: S1 and S2 Present, no Murmur, Peripheral Pulses Present Respiratory: Bilateral Air entry equal and Decreased, Clear to Auscultation, noCrackles, no wheezes Abdomen: Bowel Sound present, Soft and non tender Skin: no Rash Extremities: no Pedal edema, no calf tenderness Neurologic: Grossly no focal neuro deficit.  Labs on Admission:  CBC:  Recent Labs Lab 10/30/14 2003  WBC 8.4  HGB 12.4  HCT 38.4  MCV 84.0  PLT 228    CMP     Component Value Date/Time   NA 141 10/30/2014 2003   K  3.6 10/30/2014 2003   CL 102 10/30/2014 2003   CO2 30 10/30/2014 2003   GLUCOSE 132* 10/30/2014 2003   BUN 20 10/30/2014 2003   CREATININE 1.00 10/30/2014 2003   CALCIUM 9.7 10/30/2014 2003   GFRNONAA 60* 10/30/2014 2003   GFRAA 70* 10/30/2014 2003    No results for input(s): LIPASE, AMYLASE in the last 168 hours.   Recent Labs Lab 10/30/14 2003  TROPONINI <0.03   BNP (last 3 results) No results for input(s): BNP in the last 8760 hours.  ProBNP (last 3 results) No results for input(s): PROBNP in the last 8760 hours.   Radiological Exams on Admission: Dg Chest 2 View  10/30/2014   CLINICAL DATA:  Intermittent chest heaviness, worsening yesterday. Cough.  EXAM: CHEST  2 VIEW  COMPARISON:  None.  FINDINGS: The heart is mildly enlarged. Mediastinal shadows are unremarkable. The lungs are clear. The vascularity is normal. No effusions. No bony abnormalities.  IMPRESSION: Borderline cardiomegaly.  No active disease.   Electronically Signed   By: Nelson Chimes M.D.   On: 10/30/2014 21:34    EKG: Independently reviewed. normal sinus rhythm, incomplete RBBB.  Assessment/Plan Principal Problem:   Chest pain Active Problems:   Essential hypertension   Hyperthyroidism   Type 2 diabetes mellitus   1. Chest pain  The patient presents with chest pain which is central her initial EKG and troponin does not show any signs of acute ischemia. her chest x-ray is showing mild cardiomegaly.  At present I will admit the patient to the hospital for observation. I will obtain serial troponin every 6 hours, monitor on telemetry, get 2D echocardiogram in the morning to rule out ACS.   2. Diabetes mellitus. Holding oral hypoglycemic agents and placing her on sliding scale.  3. Essential hypertension. Continue home medications.  4. Hypothyroidism. Check TSH and free T4. Methimazole can cause epigastric discomfort. Hyperthyroidism can cause angina.  Advance goals of care discussion:  Full code   Consults: Cardiology  DVT Prophylaxis: subcutaneous Heparin Nutrition: Nothing by mouth except medications  Family Communication: Family was present at bedside, opportunity was given to ask question and all questions were answered satisfactorily at the time of interview. Disposition: Admitted to observation in telemetry unit.  Author: Berle Mull, MD [facility] Pager: [phone] 10/31/2014, 5:04 AM    If 7PM-7AM, please contact night-coverage [email or web address] Password [email or web address]

## 2014-10-31 NOTE — Progress Notes (Signed)
ETT-Nuc scheduled at [facility] for 11/09/14 at 8am. F/u scheduled 11/27/14 at 11:30am with Dr. Debara Pickett.  Dayna Dunn PA-C

## 2014-11-01 LAB — HEMOGLOBIN A1C
Hgb A1c MFr Bld: 6.8 % — ABNORMAL HIGH (ref 4.8–5.6)
MEAN PLASMA GLUCOSE: 148 mg/dL

## 2014-11-09 ENCOUNTER — Ambulatory Visit (HOSPITAL_COMMUNITY)
Admission: RE | Admit: 2014-11-09 | Discharge: 2014-11-09 | Disposition: A | Discharge status: Home / Self Care | Payer: Self-pay | Source: Ambulatory Visit | Attending: Cardiology | Admitting: Cardiology

## 2014-11-09 DIAGNOSIS — R0789 Other chest pain: Secondary | ICD-10-CM

## 2014-11-09 MED ORDER — TECHNETIUM TC 99M SESTAMIBI GENERIC - CARDIOLITE
10.9000 | Freq: Once | INTRAVENOUS | Status: AC | PRN
  Administered 2014-11-09: 10.9 via INTRAVENOUS

## 2014-11-09 MED ORDER — TECHNETIUM TC 99M SESTAMIBI GENERIC - CARDIOLITE
30.9000 | Freq: Once | INTRAVENOUS | Status: AC | PRN
  Administered 2014-11-09: 30.9 via INTRAVENOUS

## 2014-11-09 NOTE — Procedures (Addendum)
[facility] [address] [place] [id number] [phone]  Cardiology Nuclear Med Study  [name] is a 59 y.o. female     MRN : [id number]     DOB: 1955-10-17  Procedure Date: 11/09/2014  Nuclear Med Background Indication for Stress Test:  Evaluation for Ischemia, [facility] and Abnormal EKG History:  RBBB;No further cardiac or respiratory history reported;No Prior NUC MPI for comparison. Cardiac Risk Factors: Hypertension, NIDDM, Obesity and RBBB  Symptoms:  Chest Pain, DOE and Palpitations   Nuclear Pre-Procedure Caffeine/Decaff Intake:  7:00pm NPO After: 3:00am   IV Site: R Forearm  IV 0.9% NS with Angio Cath:  22g  Chest Size (in):  n/a IV Started by: Rolene Course, RN  Height: 5\' 5"  (1.651 m)  Cup Size: D  BMI:  Body mass index is 33.12 kg/(m^2). Weight:  199 lb (90.266 kg)   Tech Comments:  n/a    Nuclear Med Study 1 or 2 day study: 1 day  Stress Test Type:  Stress  Order Authorizing Provider:  Lyman Bishop, MD   Resting Radionuclide: Technetium 52m Sestamibi  Resting Radionuclide Dose: 10.9 mCi   Stress Radionuclide:  Technetium 48m Sestamibi  Stress Radionuclide Dose: 30.9 mCi           Stress Protocol Rest HR: 63 Stress HR: 150  Rest BP: 178/97 Stress BP: 229/85  Exercise Time (min): 5:06 METS: 7.00   Predicted Max HR: 161 bpm % Max HR: 93.17 bpm Rate Pressure Product: 34350  Dose of Adenosine (mg):  n/a Dose of Lexiscan: n/a mg  Dose of Atropine (mg): n/a Dose of Dobutamine: n/a mcg/kg/min (at max HR)  Stress Test Technologist: Mellody Memos, CCT Nuclear Technologist: Anthony Sar   Rest Procedure:  Myocardial perfusion imaging was performed at rest 45 minutes following the intravenous administration of Technetium 25m Sestamibi. Stress Procedure:  The patient performed treadmill exercise using a Bruce  Protocol for 5 minutes 6 seconds. The patient stopped due to elevated blood  pressure.  Patient denied any chest pain.  There were no significant ST-T wave changes.  Technetium 43m Sestamibi was injected IV at peak exercise and myocardial perfusion imaging was performed after a brief delay.  Transient Ischemic Dilatation (Normal <1.22):  1.14  QGS EDV:  73 ml QGS ESV:  20 ml LV Ejection Fraction: 72%     Rest ECG: NSR-RBBB  Stress ECG: No significant change from baseline ECG  QPS Raw Data Images:  Normal; no motion artifact; normal heart/lung ratio. Stress Images:  there is a very mild and small area of lateral wall perfusion deficit Rest Images:  there appears to be partial improvement in the small lateral wall defect Subtraction (SDS):  These findings are consistent with ischemia. LV Wall Motion:  NL LV Function; NL Wall Motion  Impression Exercise Capacity:  Fair exercise capacity. BP Response:  Hypertensive blood pressure response. Clinical Symptoms:  The exercise was limited by severe BP elevation. ECG Impression:  No significant ST segment change suggestive of ischemia. Comparison with Prior Nuclear Study: No previous nuclear study performed   Overall Impression:  Low risk stress nuclear study with a very small lateral wall defect. There may be mild ischemia present. Image quality is adequate for interpretation, but technically mediocre. Lateral wall abnormality may be an attenuation artifact.   Sanda Klein, MD  11/10/2014 5:11 PM

## 2014-11-11 NOTE — Discharge Summary (Signed)
Physician Discharge Summary  Julia Sexton BBC:[id number] DOB: April 10, 1956 DOA: 10/30/2014  PCP: Lowanda Foster, MD  Admit date: 10/30/2014 Discharge date: 10/31/2014  Time spent: 25* minutes  Recommendations for Outpatient Follow-up:  1. *Follow up PCP in 2 weeks  Discharge Diagnoses:  Principal Problem:  Chest pain Active Problems:  Essential hypertension  Hyperthyroidism  Type 2 diabetes mellitus   Discharge Condition: Stable  Diet recommendation: Low salt diet  Filed Weights   10/30/14 1946 10/31/14 0037  Weight: 89.812 kg (198 lb) 90.447 kg (199 lb 6.4 oz)    History of present illness:  59 y.o. female with Past medical history of hypertension, diabetes mellitus, hyperthyroidism. The patient is presenting with complaints of chest pain. The pain is located centrally and was like a pressure as well as dull ache. The pain is not radiating. Patient has been having this pain since last one week. The patient has started taking methimazole since last 2 months and does mentions that since last one week she has been having on and off chest pain. She stopped taking the methimazole and the pain resolved after that. And she restarted 2 days ago and the pain reoccurred after that.  Hospital Course:  Chest pain- resolved cardiac enzymes 3 negative, patient was seen by cardiology and outpatient stress test has been scheduled. Patient was taking methimazole which was causing her pain when she stopped taking methimazole the pain went away and recurred after she started taking again. At this time I'm going to hold the methimazole.  Hyperthyroidism- patient's TSH 0.033 with T4 1.39 despite being on methimazole. She will need ultrasound of thyroid as outpatient. And follow-up with the PCP for possible endocrine referral.  Diabetes mellitus- blood glucose controlled, continue taking Glucotrol and metformin.  Hypertension- continue HCTZ, metoprolol,  lisinopril.  Procedures:  None  Consultations:  Cardiology  Discharge Exam: Filed Vitals:   10/31/14 0500  BP: 140/65  Pulse: 56  Temp: 98.1 F (36.7 C)  Resp: 16    General: Appears in no acute distress Cardiovascular: S1-S2 regular Respiratory: Clear to auscultation bilaterally  Discharge Instructions   Discharge Instructions    Diet - low sodium heart healthy  Complete by: As directed      Increase activity slowly  Complete by: As directed           Current Discharge Medication List    CONTINUE these medications which have NOT CHANGED   Details  glipiZIDE (GLUCOTROL) 10 MG tablet Take 10 mg by mouth daily.     hydrochlorothiazide (HYDRODIURIL) 25 MG tablet Take 25 mg by mouth daily.    lisinopril (PRINIVIL,ZESTRIL) 40 MG tablet Take 40 mg by mouth daily.    metFORMIN (GLUCOPHAGE) 500 MG tablet Take 1,000 mg by mouth 2 (two) times daily with a meal.     metoprolol succinate (TOPROL-XL) 50 MG 24 hr tablet Take 200 mg by mouth daily. Take with or immediately following a meal.    naproxen (NAPROSYN) 500 MG tablet Take 1 tablet (500 mg total) by mouth 2 (two) times daily. Qty: 30 tablet, Refills: 0      STOP taking these medications     methimazole (TAPAZOLE) 5 MG tablet        No Known Allergies Follow-up Information    Follow up with [facility].   Specialty: Cardiology   Why: Stress test 11/09/14 at 8am - nurse will call you with instructions   Contact information:   [place]  [place] [phone]      Follow up with Pixie Casino, MD.   Specialty: Cardiology   Why: 11/27/14 at 11:30am   Contact information:   [place] [id number] [phone]         The results of significant diagnostics from this hospitalization (including imaging, microbiology, ancillary  and laboratory) are listed below for reference.    Significant Diagnostic Studies:  Imaging Results    Dg Chest 2 View  10/30/2014 CLINICAL DATA: Intermittent chest heaviness, worsening yesterday. Cough. EXAM: CHEST 2 VIEW COMPARISON: None. FINDINGS: The heart is mildly enlarged. Mediastinal shadows are unremarkable. The lungs are clear. The vascularity is normal. No effusions. No bony abnormalities. IMPRESSION: Borderline cardiomegaly. No active disease. Electronically Signed By: Nelson Chimes M.D. On: 10/30/2014 21:34     Microbiology: No results found for this or any previous visit (from the past 240 hour(s)).   Labs: Basic Metabolic Panel:  Last Labs      Recent Labs Lab 10/30/14 2003 10/31/14 0818  NA 141 139  K 3.6 4.1  CL 102 102  CO2 30 29  GLUCOSE 132* 115*  BUN 20 14  CREATININE 1.00 0.93  CALCIUM 9.7 9.5     Liver Function Tests:  Last Labs      Recent Labs Lab 10/31/14 0818  AST 22  ALT 18  ALKPHOS 90  BILITOT 0.9  PROT 6.8  ALBUMIN 3.3*      Last Labs     No results for input(s): LIPASE, AMYLASE in the last 168 hours.    Last Labs     No results for input(s): AMMONIA in the last 168 hours.   CBC:  Last Labs      Recent Labs Lab 10/30/14 2003 10/31/14 0818  WBC 8.4 7.5  HGB 12.4 12.0  HCT 38.4 37.0  MCV 84.0 84.3  PLT 228 229     Cardiac Enzymes:  Last Labs      Recent Labs Lab 10/30/14 2003 10/31/14 0417 10/31/14 0818  TROPONINI <0.03 0.03 <0.03     BNP: BNP (last 3 results)  Recent Labs (within last 365 days)    No results for input(s): BNP in the last 8760 hours.    ProBNP (last 3 results)  Recent Labs (within last 365 days)    No results for input(s): PROBNP in the last 8760 hours.    CBG:  Last Labs      Recent Labs Lab 10/31/14 0335 10/31/14 0931  GLUCAP 104* 113*         Signed:  Jadeyn Hargett S [facility] 10/31/2014, 12:27 PM

## 2014-11-27 ENCOUNTER — Ambulatory Visit (INDEPENDENT_AMBULATORY_CARE_PROVIDER_SITE_OTHER): Payer: Self-pay | Admitting: Internal Medicine

## 2014-11-27 ENCOUNTER — Encounter: Payer: Self-pay | Admitting: Internal Medicine

## 2014-11-27 VITALS — BP 195/86 | HR 62 | Ht 63.0 in | Wt 197.2 lb

## 2014-11-27 DIAGNOSIS — I10 Essential (primary) hypertension: Secondary | ICD-10-CM

## 2014-11-27 DIAGNOSIS — E118 Type 2 diabetes mellitus with unspecified complications: Secondary | ICD-10-CM

## 2014-11-27 DIAGNOSIS — R0789 Other chest pain: Secondary | ICD-10-CM

## 2014-11-27 DIAGNOSIS — E0590 Thyrotoxicosis, unspecified without thyrotoxic crisis or storm: Secondary | ICD-10-CM

## 2014-11-27 NOTE — Progress Notes (Signed)
OFFICE NOTE  Chief Complaint:  Follow-up stress test  Primary Care Physician: Julia Foster, MD  HPI:  Julia Sexton is a 59 y.o. female with a past medical history significant for hypertension, type 2 diabetes and hyperthyroidism. TSH is noted to be 0.33 during this admission. She has been taking Tapazole for the last 2 months. She reports having on and off chest pain with the medicine that seemed to improve after stopping it but then when she restarted the medicine her symptoms recurred. She reports no worsening shortness of breath with exertion, chest pain with exertion, nausea vomiting or GERD symptoms. EKG shows right bundle branch block with anteroseptal T-wave inversions and probable left atrial enlargement. Chest x-ray shows borderline cardiomegaly otherwise no significant findings. Troponin overnight is been negative 2. No further recommendations for workup in the hospital were made. I recommend an outpatient nuclear stress test. She had that stress test in our office which was negative for ischemia. EF is preserved. She is not started taking her Tapazole again and I think this was a big reason why she had chest discomfort.  PMHx:  Past Medical History  Diagnosis Date  . Hypertension   . Diabetes mellitus without complication   . Thyroid disease     Past Surgical History  Procedure Laterality Date  . Cyst excision      FAMHx:  Family History  Problem Relation Age of Onset  . Kidney disease Father     deceased  . Diabetes Mother     deceased  . Diabetes Sister   . Hypertension Sister   . Gout Sister   . Diabetes Sister   . Hypertension Sister   . Asthma Son   . GER disease Son   . GER disease Son   . Asthma Son     SOCHx:   reports that she has never smoked. She has never used smokeless tobacco. She reports that she does not drink alcohol or use illicit drugs.  ALLERGIES:  No Known Allergies  ROS: A comprehensive review of systems was  negative.  HOME MEDS: Current Outpatient Prescriptions  Medication Sig Dispense Refill  . glipiZIDE (GLUCOTROL) 10 MG tablet Take 10 mg by mouth daily.     . hydrochlorothiazide (HYDRODIURIL) 25 MG tablet Take 25 mg by mouth daily.    Marland Kitchen lisinopril (PRINIVIL,ZESTRIL) 40 MG tablet Take 40 mg by mouth daily.    . metFORMIN (GLUCOPHAGE) 500 MG tablet Take 1,000 mg by mouth 2 (two) times daily with a meal.     . metoprolol succinate (TOPROL-XL) 50 MG 24 hr tablet Take 200 mg by mouth daily. Take with or immediately following a meal.     No current facility-administered medications for this visit.    LABS/IMAGING: No results found for this or any previous visit (from the past 48 hour(s)). No results found.  WEIGHTS: Wt Readings from Last 3 Encounters:  11/27/14 197 lb 3.2 oz (89.449 kg)  11/09/14 199 lb (90.266 kg)  10/31/14 199 lb 6.4 oz (90.447 kg)    VITALS: BP 195/86 mmHg  Pulse 62  Ht 5\' 3"  (1.6 m)  Wt 197 lb 3.2 oz (89.449 kg)  BMI 34.94 kg/m2  EXAM: Deferred  EKG: Deferred  ASSESSMENT: Atypical chest pain, low risk nuclear stress test Hyperthyroidism  DM 2 Hypertension  PLAN: 1.   Ms. Julia Sexton had a low risk nuclear stress test. She does have cardiovascular risk factors including diabetes and hypertension. Be happy to see her  back annually for risk factor modification. I will defer her back to her primary care provider for further assessment and treatment of possible hyperthyroidism. She's currently not on any medication. It would seem that if she continues to be hyperthyroid she may need a different type of medication such as PTU, as she seems to have had side effects from Tapazole.  Thanks for allowing me to participate in her care.  Julia Casino, MD, [facility] Attending Cardiologist [facility]  Julia Sexton C 11/27/2014, 5:32 PM

## 2014-11-27 NOTE — Patient Instructions (Signed)
Your physician wants you to follow-up in: 1 Year. You will receive a reminder letter in the mail two months in advance. If you don't receive a letter, please call our office to schedule the follow-up appointment.  

## 2015-07-29 ENCOUNTER — Encounter (HOSPITAL_BASED_OUTPATIENT_CLINIC_OR_DEPARTMENT_OTHER): Payer: Self-pay | Admitting: Emergency Medicine

## 2015-07-29 ENCOUNTER — Emergency Department (HOSPITAL_BASED_OUTPATIENT_CLINIC_OR_DEPARTMENT_OTHER)
Admission: EM | Admit: 2015-07-29 | Discharge: 2015-07-29 | Disposition: A | Discharge status: Home / Self Care | Payer: Self-pay | Attending: Emergency Medicine | Admitting: Emergency Medicine

## 2015-07-29 ENCOUNTER — Emergency Department (HOSPITAL_BASED_OUTPATIENT_CLINIC_OR_DEPARTMENT_OTHER): Payer: 59

## 2015-07-29 ENCOUNTER — Other Ambulatory Visit: Payer: Self-pay

## 2015-07-29 DIAGNOSIS — I10 Essential (primary) hypertension: Secondary | ICD-10-CM | POA: Insufficient documentation

## 2015-07-29 DIAGNOSIS — E119 Type 2 diabetes mellitus without complications: Secondary | ICD-10-CM | POA: Insufficient documentation

## 2015-07-29 DIAGNOSIS — Z7984 Long term (current) use of oral hypoglycemic drugs: Secondary | ICD-10-CM | POA: Insufficient documentation

## 2015-07-29 DIAGNOSIS — Z79899 Other long term (current) drug therapy: Secondary | ICD-10-CM | POA: Insufficient documentation

## 2015-07-29 DIAGNOSIS — R0789 Other chest pain: Secondary | ICD-10-CM | POA: Insufficient documentation

## 2015-07-29 LAB — CBC WITH DIFFERENTIAL/PLATELET
Basophils Absolute: 0 10*3/uL (ref 0.0–0.1)
Basophils Relative: 0 %
EOS ABS: 0.2 10*3/uL (ref 0.0–0.7)
EOS PCT: 2 %
HCT: 37.9 % (ref 36.0–46.0)
Hemoglobin: 12.1 g/dL (ref 12.0–15.0)
LYMPHS ABS: 1.6 10*3/uL (ref 0.7–4.0)
Lymphocytes Relative: 17 %
MCH: 27.7 pg (ref 26.0–34.0)
MCHC: 31.9 g/dL (ref 30.0–36.0)
MCV: 86.7 fL (ref 78.0–100.0)
MONOS PCT: 7 %
Monocytes Absolute: 0.6 10*3/uL (ref 0.1–1.0)
Neutro Abs: 7.1 10*3/uL (ref 1.7–7.7)
Neutrophils Relative %: 74 %
PLATELETS: 234 10*3/uL (ref 150–400)
RBC: 4.37 MIL/uL (ref 3.87–5.11)
RDW: 13.6 % (ref 11.5–15.5)
WBC: 9.6 10*3/uL (ref 4.0–10.5)

## 2015-07-29 LAB — BASIC METABOLIC PANEL
Anion gap: 6 (ref 5–15)
BUN: 17 mg/dL (ref 6–20)
CO2: 29 mmol/L (ref 22–32)
Calcium: 9.1 mg/dL (ref 8.9–10.3)
Chloride: 104 mmol/L (ref 101–111)
Creatinine, Ser: 1.02 mg/dL — ABNORMAL HIGH (ref 0.44–1.00)
GFR calc Af Amer: >60 mL/min (ref >60)
GFR, EST NON AFRICAN AMERICAN: 59 mL/min — AB (ref >60)
Glucose, Bld: 141 mg/dL — ABNORMAL HIGH (ref 65–99)
Potassium: 3.4 mmol/L — ABNORMAL LOW (ref 3.5–5.1)
SODIUM: 139 mmol/L (ref 135–145)

## 2015-07-29 LAB — TROPONIN I: Troponin I: <0.03 ng/mL (ref <0.031)

## 2015-07-29 NOTE — ED Notes (Signed)
Patient states that about 30 minutes ago she had dull ache pressure to her left chest. Denies any radiation.

## 2015-07-29 NOTE — ED Provider Notes (Signed)
CSN: [id number]     Arrival date & time 07/29/15  [id number] History   First MD Initiated Contact with Patient 07/29/15 0052     Chief Complaint  Patient presents with  . Chest Pain     (Consider location/radiation/quality/duration/timing/severity/associated sxs/prior Treatment) HPI  This is a 59 year old female with history of hypertension and diabetes who presents with chest pain. Patient reports that at approximately 11:30 PM she had onset of a dull pressure-like pain over her anterior chest. It is nonradiating. It lasted approximately 30 minutes. She was sitting down when she first noted the pain. She is currently pain-free. She denies any associated shortness of breath or diaphoresis. Denies any recent fevers or cough. No recent hospitalizations, leg swelling, or history of blood clot.  Patient was admitted in April for chest pain rule out. At that time she was evaluated by cardiology and had an outpatient stress test that was low risk. She followed with Dr. Debara Pickett. Patient reports that she is due for additional testing at the end of the month.  Past Medical History  Diagnosis Date  . Hypertension   . Diabetes mellitus without complication ([place])   . Thyroid disease    Past Surgical History  Procedure Laterality Date  . Cyst excision     Family History  Problem Relation Age of Onset  . Kidney disease Father     deceased  . Diabetes Mother     deceased  . Diabetes Sister   . Hypertension Sister   . Gout Sister   . Diabetes Sister   . Hypertension Sister   . Asthma Son   . GER disease Son   . GER disease Son   . Asthma Son    Social History  Substance Use Topics  . Smoking status: Never Smoker   . Smokeless tobacco: Never Used  . Alcohol Use: No   OB History    No data available     Review of Systems  Constitutional: Negative for fever.  Respiratory: Positive for chest tightness. Negative for cough and shortness of breath.   Cardiovascular: Positive for chest pain.  Negative for leg swelling.  Gastrointestinal: Negative for nausea, vomiting and abdominal pain.  Genitourinary: Negative for dysuria.  Neurological: Negative for headaches.  All other systems reviewed and are negative.     Allergies  Review of patient's allergies indicates no known allergies.  Home Medications   Prior to Admission medications   Medication Sig Start Date End Date Taking? Authorizing Provider  glipiZIDE (GLUCOTROL) 10 MG tablet Take 10 mg by mouth daily.     Historical Provider, MD  hydrochlorothiazide (HYDRODIURIL) 25 MG tablet Take 25 mg by mouth daily.    Historical Provider, MD  lisinopril (PRINIVIL,ZESTRIL) 40 MG tablet Take 40 mg by mouth daily.    Historical Provider, MD  metFORMIN (GLUCOPHAGE) 500 MG tablet Take 1,000 mg by mouth 2 (two) times daily with a meal.     Historical Provider, MD  metoprolol succinate (TOPROL-XL) 50 MG 24 hr tablet Take 200 mg by mouth daily. Take with or immediately following a meal.    Historical Provider, MD   BP 171/83 mmHg  Pulse 64  Temp(Src) 98 F (36.7 C) (Oral)  Resp 20  Ht 5\' 4"  (1.626 m)  Wt 195 lb (88.451 kg)  BMI 33.46 kg/m2  SpO2 94% Physical Exam  Constitutional: She is oriented to person, place, and time. She appears well-developed and well-nourished. No distress.  HENT:  Head: Normocephalic and atraumatic.  Cardiovascular: Normal rate, regular rhythm and normal heart sounds.   No murmur heard. Pulmonary/Chest: Effort normal and breath sounds normal. No respiratory distress. She has no wheezes. She exhibits no tenderness.  Abdominal: Soft. Bowel sounds are normal. There is no tenderness. There is no rebound.  Musculoskeletal: She exhibits no edema.  Neurological: She is alert and oriented to person, place, and time.  Skin: Skin is warm and dry.  Psychiatric: She has a normal mood and affect.  Nursing note and vitals reviewed.   ED Course  Procedures (including critical care time) Labs Review Labs  Reviewed  BASIC METABOLIC PANEL - Abnormal; Notable for the following:    Potassium 3.4 (*)    Glucose, Bld 141 (*)    Creatinine, Ser 1.02 (*)    GFR calc non Af Amer 59 (*)    All other components within normal limits  CBC WITH DIFFERENTIAL/PLATELET  TROPONIN I  TROPONIN I    Imaging Review Dg Chest 2 View  07/29/2015  CLINICAL DATA:  Acute onset of left-sided chest pressure and ache. Initial encounter. EXAM: CHEST  2 VIEW COMPARISON:  Chest radiograph from 10/30/2014 FINDINGS: The lungs are well-aerated and clear. There is no evidence of focal opacification, pleural effusion or pneumothorax. The heart is mildly enlarged. No acute osseous abnormalities are seen. IMPRESSION: Mild cardiomegaly.  Lungs remain grossly clear. Electronically Signed   By: Garald Balding M.D.   On: 07/29/2015 01:07   I have personally reviewed and evaluated these images and lab results as part of my medical decision-making.   EKG Interpretation   Date/Time:  Sunday July 29 2015 00:50:45 EST Ventricular Rate:  61 PR Interval:  178 QRS Duration: 98 QT Interval:  438 QTC Calculation: 440 R Axis:   77 Text Interpretation:  Normal sinus rhythm Incomplete right bundle branch  block T wave abnormality, consider anterior ischemia Abnormal ECG No  significant change since last tracing Confirmed by HORTON  MD, Cornell  [id number]) on 07/29/2015 3:26:49 AM      MDM   Final diagnoses:  Other chest pain    Patient presents with chest pain. Currently chest pain-free. Chest pain began at 11:30 PM. It was nonexertional. It was pressure-like. She certainly has risk factors for ACS including hypertension and diabetes. No leg swelling and normal vital signs. Doubt PE. EKG is unchanged from prior with incomplete right bundle branch block and nonspecific T-wave abnormalities. Chest x-ray is reassuring. Troponin 2 with the second troponin at 4 hours is negative. Patient has been chest pain-free while in the emergency  room. Her heart score is 34 EKG, risk factors, and age.  Discussed with patient follow-up with cardiology. She will call Dr. Lysbeth Penner office for follow-up first thing this week.  After history, exam, and medical workup I feel the patient has been appropriately medically screened and is safe for discharge home. Pertinent diagnoses were discussed with the patient. Patient was given return precautions.     Merryl Hacker, MD 07/29/15 [phone]

## 2015-07-29 NOTE — Discharge Instructions (Signed)
You were seen today for chest pain. Her workup is reassuring. You need follow-up with her cardiologist as soon as possible for reassessment. If you develop worsening chest pain, shortness breath, or any new or worsening symptoms she should be reevaluated.  Nonspecific Chest Pain  Chest pain can be caused by many different conditions. There is always a chance that your pain could be related to something serious, such as a heart attack or a blood clot in your lungs. Chest pain can also be caused by conditions that are not life-threatening. If you have chest pain, it is very important to follow up with your health care provider. CAUSES  Chest pain can be caused by:  Heartburn.  Pneumonia or bronchitis.  Anxiety or stress.  Inflammation around your heart (pericarditis) or lung (pleuritis or pleurisy).  A blood clot in your lung.  A collapsed lung (pneumothorax). It can develop suddenly on its own (spontaneous pneumothorax) or from trauma to the chest.  Shingles infection (varicella-zoster virus).  Heart attack.  Damage to the bones, muscles, and cartilage that make up your chest wall. This can include:  Bruised bones due to injury.  Strained muscles or cartilage due to frequent or repeated coughing or overwork.  Fracture to one or more ribs.  Sore cartilage due to inflammation (costochondritis). RISK FACTORS  Risk factors for chest pain may include:  Activities that increase your risk for trauma or injury to your chest.  Respiratory infections or conditions that cause frequent coughing.  Medical conditions or overeating that can cause heartburn.  Heart disease or family history of heart disease.  Conditions or health behaviors that increase your risk of developing a blood clot.  Having had chicken pox (varicella zoster). SIGNS AND SYMPTOMS Chest pain can feel like:  Burning or tingling on the surface of your chest or deep in your chest.  Crushing, pressure, aching, or  squeezing pain.  Dull or sharp pain that is worse when you move, cough, or take a deep breath.  Pain that is also felt in your back, neck, shoulder, or arm, or pain that spreads to any of these areas. Your chest pain may come and go, or it may stay constant. DIAGNOSIS Lab tests or other studies may be needed to find the cause of your pain. Your health care provider may have you take a test called an ambulatory ECG (electrocardiogram). An ECG records your heartbeat patterns at the time the test is performed. You may also have other tests, such as:  Transthoracic echocardiogram (TTE). During echocardiography, sound waves are used to create a picture of all of the heart structures and to look at how blood flows through your heart.  Transesophageal echocardiogram (TEE).This is a more advanced imaging test that obtains images from inside your body. It allows your health care provider to see your heart in finer detail.  Cardiac monitoring. This allows your health care provider to monitor your heart rate and rhythm in real time.  Holter monitor. This is a portable device that records your heartbeat and can help to diagnose abnormal heartbeats. It allows your health care provider to track your heart activity for several days, if needed.  Stress tests. These can be done through exercise or by taking medicine that makes your heart beat more quickly.  Blood tests.  Imaging tests. TREATMENT  Your treatment depends on what is causing your chest pain. Treatment may include:  Medicines. These may include:  Acid blockers for heartburn.  Anti-inflammatory medicine.  Pain medicine  for inflammatory conditions.  Antibiotic medicine, if an infection is present.  Medicines to dissolve blood clots.  Medicines to treat coronary artery disease.  Supportive care for conditions that do not require medicines. This may include:  Resting.  Applying heat or cold packs to injured areas.  Limiting  activities until pain decreases. HOME CARE INSTRUCTIONS  If you were prescribed an antibiotic medicine, finish it all even if you start to feel better.  Avoid any activities that bring on chest pain.  Do not use any tobacco products, including cigarettes, chewing tobacco, or electronic cigarettes. If you need help quitting, ask your health care provider.  Do not drink alcohol.  Take medicines only as directed by your health care provider.  Keep all follow-up visits as directed by your health care provider. This is important. This includes any further testing if your chest pain does not go away.  If heartburn is the cause for your chest pain, you may be told to keep your head raised (elevated) while sleeping. This reduces the chance that acid will go from your stomach into your esophagus.  Make lifestyle changes as directed by your health care provider. These may include:  Getting regular exercise. Ask your health care provider to suggest some activities that are safe for you.  Eating a heart-healthy diet. A registered dietitian can help you to learn healthy eating options.  Maintaining a healthy weight.  Managing diabetes, if necessary.  Reducing stress. SEEK MEDICAL CARE IF:  Your chest pain does not go away after treatment.  You have a rash with blisters on your chest.  You have a fever. SEEK IMMEDIATE MEDICAL CARE IF:   Your chest pain is worse.  You have an increasing cough, or you cough up blood.  You have severe abdominal pain.  You have severe weakness.  You faint.  You have chills.  You have sudden, unexplained chest discomfort.  You have sudden, unexplained discomfort in your arms, back, neck, or jaw.  You have shortness of breath at any time.  You suddenly start to sweat, or your skin gets clammy.  You feel nauseous or you vomit.  You suddenly feel light-headed or dizzy.  Your heart begins to beat quickly, or it feels like it is skipping  beats. These symptoms may represent a serious problem that is an emergency. Do not wait to see if the symptoms will go away. Get medical help right away. Call your local emergency services (911 in the [place] Do not drive yourself to the hospital.   This information is not intended to replace advice given to you by your health care provider. Make sure you discuss any questions you have with your health care provider.   Document Released: 04/30/2005 Document Revised: 08/11/2014 Document Reviewed: 02/24/2014 Elsevier Interactive Patient Education [employer].

## 2016-06-15 ENCOUNTER — Encounter (HOSPITAL_BASED_OUTPATIENT_CLINIC_OR_DEPARTMENT_OTHER): Payer: Self-pay | Admitting: *Deleted

## 2016-06-15 ENCOUNTER — Emergency Department (HOSPITAL_BASED_OUTPATIENT_CLINIC_OR_DEPARTMENT_OTHER)
Admission: EM | Admit: 2016-06-15 | Discharge: 2016-06-15 | Disposition: A | Discharge status: Home / Self Care | Payer: Self-pay | Attending: Emergency Medicine | Admitting: Emergency Medicine

## 2016-06-15 DIAGNOSIS — N61 Mastitis without abscess: Secondary | ICD-10-CM | POA: Insufficient documentation

## 2016-06-15 DIAGNOSIS — I10 Essential (primary) hypertension: Secondary | ICD-10-CM | POA: Insufficient documentation

## 2016-06-15 DIAGNOSIS — Z79899 Other long term (current) drug therapy: Secondary | ICD-10-CM | POA: Insufficient documentation

## 2016-06-15 DIAGNOSIS — N611 Abscess of the breast and nipple: Secondary | ICD-10-CM | POA: Insufficient documentation

## 2016-06-15 DIAGNOSIS — E119 Type 2 diabetes mellitus without complications: Secondary | ICD-10-CM | POA: Insufficient documentation

## 2016-06-15 DIAGNOSIS — N6452 Nipple discharge: Secondary | ICD-10-CM

## 2016-06-15 DIAGNOSIS — Z7984 Long term (current) use of oral hypoglycemic drugs: Secondary | ICD-10-CM | POA: Insufficient documentation

## 2016-06-15 LAB — CBC WITH DIFFERENTIAL/PLATELET
BASOS ABS: 0 10*3/uL (ref 0.0–0.1)
Basophils Relative: 0 %
Eosinophils Absolute: 0.1 10*3/uL (ref 0.0–0.7)
Eosinophils Relative: 1 %
HEMATOCRIT: 38.6 % (ref 36.0–46.0)
Hemoglobin: 12.7 g/dL (ref 12.0–15.0)
Lymphocytes Relative: 17 %
Lymphs Abs: 1.8 10*3/uL (ref 0.7–4.0)
MCH: 28.4 pg (ref 26.0–34.0)
MCHC: 32.9 g/dL (ref 30.0–36.0)
MCV: 86.4 fL (ref 78.0–100.0)
Monocytes Absolute: 0.8 10*3/uL (ref 0.1–1.0)
Monocytes Relative: 8 %
NEUTROS ABS: 8.3 10*3/uL — AB (ref 1.7–7.7)
Neutrophils Relative %: 74 %
PLATELETS: 210 10*3/uL (ref 150–400)
RBC: 4.47 MIL/uL (ref 3.87–5.11)
RDW: 13.9 % (ref 11.5–15.5)
WBC: 11 10*3/uL — ABNORMAL HIGH (ref 4.0–10.5)

## 2016-06-15 LAB — BASIC METABOLIC PANEL
ANION GAP: 8 (ref 5–15)
BUN: 16 mg/dL (ref 6–20)
CO2: 26 mmol/L (ref 22–32)
Calcium: 9.4 mg/dL (ref 8.9–10.3)
Chloride: 102 mmol/L (ref 101–111)
Creatinine, Ser: 0.92 mg/dL (ref 0.44–1.00)
GFR calc Af Amer: >60 mL/min (ref >60)
GFR calc non Af Amer: >60 mL/min (ref >60)
Glucose, Bld: 109 mg/dL — ABNORMAL HIGH (ref 65–99)
Potassium: 3.3 mmol/L — ABNORMAL LOW (ref 3.5–5.1)
Sodium: 136 mmol/L (ref 135–145)

## 2016-06-15 MED ORDER — CEPHALEXIN 500 MG PO CAPS
500.0000 mg | ORAL_CAPSULE | Freq: Four times a day (QID) | ORAL | 0 refills | Status: AC
Start: 2016-06-15 — End: 2016-06-22

## 2016-06-15 MED ORDER — SULFAMETHOXAZOLE-TRIMETHOPRIM 800-160 MG PO TABS: 1.0000 | ORAL_TABLET | Freq: Two times a day (BID) | ORAL | 0 refills | Status: AC

## 2016-06-15 MED ORDER — CEPHALEXIN 250 MG PO CAPS
500.0000 mg | ORAL_CAPSULE | Freq: Once | ORAL | Status: AC
  Administered 2016-06-15: 500 mg via ORAL
  Filled 2016-06-15: qty 2

## 2016-06-15 MED ORDER — ACETAMINOPHEN 325 MG PO TABS
650.0000 mg | ORAL_TABLET | Freq: Once | ORAL | Status: AC
  Administered 2016-06-15: 650 mg via ORAL
  Filled 2016-06-15: qty 2

## 2016-06-15 NOTE — ED Notes (Signed)
Patient with right breast discharge that started on Saturday and has been intermittent in nature. Patient denies any discharge throughout today, until just PTA. Patient states this has never happened before. Patient reports the discharge is brown is color, denies any odor to the discharge. Patient also reports pain 3-5/10 to the breast. Patient is also febrile in the ER on arrival.

## 2016-06-15 NOTE — ED Triage Notes (Signed)
Right breast pain and discharge x 1 day.

## 2016-06-15 NOTE — Discharge Instructions (Addendum)
Take antibiotic every 6 hours for 7 days, make sure you finish the entire course Take tylenol for pain and fever as needed Please call [facility] to make an appointment with an OBGYN as soon as you can for further work up and treatment  Please call the surgery team to see if there is an abscess, and if any drainage is needed. Return to ED if you develop severe pain, high fever that do not respond to treatment

## 2016-06-15 NOTE — ED Notes (Signed)
Pt given d/c instructions as per chart. Rx x 2. Verbalizes understanding. No questions. 

## 2016-06-16 NOTE — ED Provider Notes (Signed)
[facility] DEPT Provider Note   CSN: [id number] Arrival date & time: 06/15/16  1738     History   Chief Complaint Chief Complaint  Patient presents with  . Breast Pain    HPI Julia Sexton is a 60 y.o. female with pmh of DM, HTN who presents to ED from with constant, throbbing R breast pain and heaviness  that increases with bending forward associated with bloody, foul smelling R nipple discharge x 1 day.  Pt reports mild R breast enlargement and fever.  Pt reports most recent mammogram on May/June 2017 was normal.  She denies recent unexpected weight loss, weakness, fatigue.  No previous history of chest/neck radiation.  Pt denies shaving breast area or recent trauma.    HPI  Past Medical History:  Diagnosis Date  . Diabetes mellitus without complication ([place])   . Hypertension     Patient Active Problem List   Diagnosis Date Noted  . Essential hypertension 10/31/2014  . Hyperthyroidism 10/31/2014  . Type 2 diabetes mellitus ([place]) 10/31/2014  . Chest pain 10/30/2014    Past Surgical History:  Procedure Laterality Date  . CYST EXCISION      OB History    No data available       Home Medications    Prior to Admission medications   Medication Sig Start Date End Date Taking? Authorizing Provider  cephALEXin (KEFLEX) 500 MG capsule Take 1 capsule (500 mg total) by mouth 4 (four) times daily. 06/15/16 06/22/16  Kinnie Feil, PA-C  glipiZIDE (GLUCOTROL) 10 MG tablet Take 10 mg by mouth daily.     Historical Provider, MD  hydrochlorothiazide (HYDRODIURIL) 25 MG tablet Take 25 mg by mouth daily.    Historical Provider, MD  lisinopril (PRINIVIL,ZESTRIL) 40 MG tablet Take 40 mg by mouth daily.    Historical Provider, MD  metFORMIN (GLUCOPHAGE) 500 MG tablet Take 1,000 mg by mouth 2 (two) times daily with a meal.     Historical Provider, MD  metoprolol succinate (TOPROL-XL) 50 MG 24 hr tablet Take 200 mg by mouth daily. Take with or immediately following a meal.     Historical Provider, MD  sulfamethoxazole-trimethoprim (BACTRIM DS,SEPTRA DS) 800-160 MG tablet Take 1 tablet by mouth 2 (two) times daily. 06/15/16 06/22/16  Varney Biles, MD    Family History Family History  Problem Relation Age of Onset  . Kidney disease Father     deceased  . Diabetes Mother     deceased  . Diabetes Sister   . Hypertension Sister   . Gout Sister   . Diabetes Sister   . Hypertension Sister   . Asthma Son   . GER disease Son   . GER disease Son   . Asthma Son     Social History Social History  Substance Use Topics  . Smoking status: Never Smoker  . Smokeless tobacco: Never Used  . Alcohol use No     Allergies   Patient has no known allergies.   Review of Systems Review of Systems  Constitutional: Positive for fever. Negative for chills, fatigue and unexpected weight change.  HENT: Negative for congestion, sneezing and sore throat.   Eyes: Negative for visual disturbance.  Respiratory: Negative for cough, chest tightness and shortness of breath.   Cardiovascular: Negative for chest pain.  Gastrointestinal: Negative for abdominal pain, constipation, diarrhea, nausea and vomiting.  Genitourinary: Negative for dysuria, flank pain, hematuria, pelvic pain and vaginal bleeding.  Musculoskeletal: Negative for back pain and neck pain.  Neurological: Negative for dizziness, speech difficulty, weakness, light-headedness and numbness.     Physical Exam Updated Vital Signs BP 137/91 (BP Location: Right Arm)   Pulse 72   Temp 98.4 F (36.9 C) (Oral)   Resp 20   Ht 5\' 3"  (1.6 m)   Wt 90.7 kg   SpO2 95%   BMI 35.43 kg/m   Physical Exam  Constitutional: She is oriented to person, place, and time. She appears well-developed and well-nourished. No distress.  HENT:  Head: Normocephalic.  Mouth/Throat: Oropharynx is clear and moist. No oropharyngeal exudate.  Eyes: Conjunctivae are normal. Pupils are equal, round, and reactive to light.  Neck: Neck  supple.  Cardiovascular: Normal rate, regular rhythm and normal heart sounds.   No murmur heard. Pulmonary/Chest: Effort normal and breath sounds normal. No respiratory distress.  Abdominal: Soft. She exhibits no distension and no mass. There is no tenderness.  Musculoskeletal: Normal range of motion. She exhibits no edema.  Lymphadenopathy:    She has no cervical adenopathy.  Neurological: She is alert and oriented to person, place, and time.  Skin: Skin is warm and dry.     Psychiatric: She has a normal mood and affect.     ED Treatments / Results  Labs (all labs ordered are listed, but only abnormal results are displayed) Labs Reviewed  CBC WITH DIFFERENTIAL/PLATELET - Abnormal; Notable for the following:       Result Value   WBC 11.0 (*)    Neutro Abs 8.3 (*)    All other components within normal limits  BASIC METABOLIC PANEL - Abnormal; Notable for the following:    Potassium 3.3 (*)    Glucose, Bld 109 (*)    All other components within normal limits  AEROBIC CULTURE (SUPERFICIAL SPECIMEN)    EKG  EKG Interpretation None       Radiology No results found.  Procedures Procedures (including critical care time)  Medications Ordered in ED Medications  acetaminophen (TYLENOL) tablet 650 mg (650 mg Oral Given 06/15/16 1809)  cephALEXin (KEFLEX) capsule 500 mg (500 mg Oral Given 06/15/16 2111)     Initial Impression / Assessment and Plan / ED Course  I have reviewed the triage vital signs and the nursing notes.  Pertinent labs & imaging results that were available during my care of the patient were reviewed by me and considered in my medical decision making (see chart for details).  Clinical Course    60 year old female with pmh of DM and HTN presents with 1 day of R breast pain, mild enlargement with brown, foul smelling nipple discharge.  Pt febrile in ED, exam notable for erythema, warmth and mild edema over R areola with surrounding induration.  Brown,  foul smelling discharge actively draining from R nipple during exam, increase in drainage with light palpation over area of induration.  Exam concerning for breast abscess, mastitis.  Breast malignancy also on differential, although pt reports last mammogram this year was normal.  Unable to find record of this mammogram. Pt reports tolerable breast pain.  She will be discharged with keflex and bactrim, tylenol/ibuprofen q6-8 for pain as needed and referral to [facility] clinic and [facility] for evaluation and possible imaging.  Wound culture pending.  Pt agreeable to plan.  Pt discussed with Dr. Kathrynn Humble who agreed with dispo plan. Return precautions given to pt.    Final Clinical Impressions(s) / ED Diagnoses   Final diagnoses:  Breast discharge  Breast abscess  Cellulitis  of breast    New Prescriptions Discharge Medication List as of 06/15/2016  9:55 PM    START taking these medications   Details  cephALEXin (KEFLEX) 500 MG capsule Take 1 capsule (500 mg total) by mouth 4 (four) times daily., Starting Sun 06/15/2016, Until Sun 06/22/2016, Print    sulfamethoxazole-trimethoprim (BACTRIM DS,SEPTRA DS) 800-160 MG tablet Take 1 tablet by mouth 2 (two) times daily., Starting Sun 06/15/2016, Until Sun 06/22/2016, [place], PA-C 06/17/16 [place], MD 06/17/16 1316

## 2016-06-19 LAB — AEROBIC CULTURE  (SUPERFICIAL SPECIMEN)

## 2016-06-19 LAB — AEROBIC CULTURE W GRAM STAIN (SUPERFICIAL SPECIMEN): Culture: NO GROWTH

## 2018-08-04 ENCOUNTER — Emergency Department (HOSPITAL_BASED_OUTPATIENT_CLINIC_OR_DEPARTMENT_OTHER)
Admission: EM | Admit: 2018-08-04 | Discharge: 2018-08-04 | Disposition: A | Discharge status: Home / Self Care | Payer: Self-pay | Attending: Emergency Medicine | Admitting: Emergency Medicine

## 2018-08-04 ENCOUNTER — Other Ambulatory Visit: Payer: Self-pay

## 2018-08-04 ENCOUNTER — Emergency Department (HOSPITAL_BASED_OUTPATIENT_CLINIC_OR_DEPARTMENT_OTHER): Payer: Self-pay

## 2018-08-04 ENCOUNTER — Encounter (HOSPITAL_BASED_OUTPATIENT_CLINIC_OR_DEPARTMENT_OTHER): Payer: Self-pay | Admitting: Emergency Medicine

## 2018-08-04 DIAGNOSIS — Z7984 Long term (current) use of oral hypoglycemic drugs: Secondary | ICD-10-CM | POA: Insufficient documentation

## 2018-08-04 DIAGNOSIS — I10 Essential (primary) hypertension: Secondary | ICD-10-CM | POA: Insufficient documentation

## 2018-08-04 DIAGNOSIS — R69 Illness, unspecified: Secondary | ICD-10-CM

## 2018-08-04 DIAGNOSIS — E119 Type 2 diabetes mellitus without complications: Secondary | ICD-10-CM | POA: Insufficient documentation

## 2018-08-04 DIAGNOSIS — J111 Influenza due to unidentified influenza virus with other respiratory manifestations: Secondary | ICD-10-CM | POA: Insufficient documentation

## 2018-08-04 MED ORDER — ACETAMINOPHEN 325 MG PO TABS
650.0000 mg | ORAL_TABLET | Freq: Once | ORAL | Status: AC | PRN
  Administered 2018-08-04: 650 mg via ORAL
  Filled 2018-08-04: qty 2

## 2018-08-04 NOTE — ED Provider Notes (Signed)
[facility] EMERGENCY DEPARTMENT Provider Note   CSN: [id number] Arrival date & time: 08/04/18  1744     History   Chief Complaint Chief Complaint  Patient presents with  . Cough    HPI Julia Sexton is a 63 y.o. female.  63 year old female presents with complaint of cough, congestion, fatigue x4 days.  Patient has been taking Mucinex at home for her symptoms with limited relief.  Cough is nonproductive, denies body aches.  Patient was unaware she was running a fever until her temperature was checked in triage, oral temp of 102.6.  Patient reported to triage feeling dizzy, she clarifies this is feeling fatigued and generally weak.  No history of asthma or chronic lung disease, patient is a non-smoker.  Patient treated for breast cancer last year with mastectomy, did not require any chemo or radiation, no other hospitalizations.  No other complaints or concerns.     Past Medical History:  Diagnosis Date  . Diabetes mellitus without complication ([place])   . Hypertension     Patient Active Problem List   Diagnosis Date Noted  . Essential hypertension 10/31/2014  . Hyperthyroidism 10/31/2014  . Type 2 diabetes mellitus ([place]) 10/31/2014  . Chest pain 10/30/2014    Past Surgical History:  Procedure Laterality Date  . CYST EXCISION       OB History   No obstetric history on file.      Home Medications    Prior to Admission medications   Medication Sig Start Date End Date Taking? Authorizing Provider  glipiZIDE (GLUCOTROL) 10 MG tablet Take 10 mg by mouth daily.     [provider]  hydrochlorothiazide (HYDRODIURIL) 25 MG tablet Take 25 mg by mouth daily.    [provider]  lisinopril (PRINIVIL,ZESTRIL) 40 MG tablet Take 40 mg by mouth daily.    [provider]  metFORMIN (GLUCOPHAGE) 500 MG tablet Take 1,000 mg by mouth 2 (two) times daily with a meal.     [provider]  metoprolol succinate (TOPROL-XL) 50 MG 24 hr tablet  Take 200 mg by mouth daily. Take with or immediately following a meal.    [provider]    Family History Family History  Problem Relation Age of Onset  . Kidney disease Father        deceased  . Diabetes Mother        deceased  . Diabetes Sister   . Hypertension Sister   . Gout Sister   . Diabetes Sister   . Hypertension Sister   . Asthma Son   . GER disease Son   . GER disease Son   . Asthma Son     Social History Social History   Tobacco Use  . Smoking status: Never Smoker  . Smokeless tobacco: Never Used  Substance Use Topics  . Alcohol use: No  . Drug use: No     Allergies   Patient has no known allergies.   Review of Systems Review of Systems  Constitutional: Positive for fatigue and fever. Negative for chills and diaphoresis.  HENT: Positive for congestion. Negative for ear pain, sinus pressure, sinus pain, sneezing and sore throat.   Respiratory: Positive for cough and wheezing. Negative for shortness of breath.   Cardiovascular: Negative for chest pain.  Gastrointestinal: Negative for abdominal pain, constipation, diarrhea, nausea and vomiting.  Genitourinary: Negative for dysuria and frequency.  Musculoskeletal: Negative for arthralgias and myalgias.  Skin: Negative for rash and wound.  Allergic/Immunologic:  Positive for immunocompromised state.  Neurological: Positive for weakness.  Hematological: Negative for adenopathy.  Psychiatric/Behavioral: Negative for confusion.  All other systems reviewed and are negative.    Physical Exam Updated Vital Signs BP 137/90   Pulse (!) 102   Temp (!) 102.6 F (39.2 C) (Oral)   Resp 18   Ht 5\' 3"  (1.6 m)   Wt 84.4 kg   SpO2 93%   BMI 32.95 kg/m   Physical Exam Vitals signs and nursing note reviewed.  Constitutional:      General: She is not in acute distress.    Appearance: She is well-developed. She is not diaphoretic.  HENT:     Head: Normocephalic and atraumatic.     Right Ear:  Tympanic membrane and ear canal normal.     Left Ear: Tympanic membrane and ear canal normal.     Nose: Mucosal edema and congestion present. No rhinorrhea.     Mouth/Throat:     Mouth: Mucous membranes are moist.     Pharynx: No oropharyngeal exudate or posterior oropharyngeal erythema.  Eyes:     Conjunctiva/sclera: Conjunctivae normal.  Neck:     Musculoskeletal: Neck supple. No muscular tenderness.  Cardiovascular:     Rate and Rhythm: Regular rhythm. Tachycardia present.     Pulses: Normal pulses.     Heart sounds: Normal heart sounds. No murmur.  Pulmonary:     Effort: Pulmonary effort is normal.     Breath sounds: Normal breath sounds. No wheezing.  Lymphadenopathy:     Cervical: No cervical adenopathy.  Skin:    General: Skin is warm and dry.     Findings: No erythema or rash.  Neurological:     Mental Status: She is alert and oriented to person, place, and time.  Psychiatric:        Behavior: Behavior normal.      ED Treatments / Results  Labs (all labs ordered are listed, but only abnormal results are displayed) Labs Reviewed - No data to display  EKG None  Radiology Dg Chest 2 View  Result Date: 08/04/2018 CLINICAL DATA:  Cough and fever for 5 days EXAM: CHEST - 2 VIEW COMPARISON:  July 29, 2015 FINDINGS: The heart size and mediastinal contours are within normal limits. There is no focal infiltrate, pulmonary edema, or pleural effusion. The visualized skeletal structures are stable. Surgical clips over the right breasts is identified. IMPRESSION: No active cardiopulmonary disease. Electronically Signed   By: Abelardo Diesel M.D.   On: 08/04/2018 18:43    Procedures Procedures (including critical care time)  Medications Ordered in ED Medications  acetaminophen (TYLENOL) tablet 650 mg (650 mg Oral Given 08/04/18 1809)     Initial Impression / Assessment and Plan / ED Course  I have reviewed the triage vital signs and the nursing notes.  Pertinent labs &  imaging results that were available during my care of the patient were reviewed by me and considered in my medical decision making (see chart for details).  Clinical Course as of Aug 04 1913  Wed Aug 04, 6172  4392 63 year old female presents with complaint of cough, congestion, fatigue x4 days.  Patient states that she started to feel unwell today which prompted her visit to the ER.  Patient was found to be febrile in triage with a temperature of 102.6, she was given Tylenol and states she is feeling much better at this time.  Patient's vital signs were repeated, she is currently satting well at 97%  on room air, heart rate is 100 regular rate and rhythm, blood pressure unremarkable.  Patient is outside of the treatment window for Tamiflu, her chest x-ray does not suggest pneumonia, her lung sounds are clear.  Recommend patient continue with Motrin and Tylenol at home as well as push hydrating fluids.  She can also continue with her Mucinex as directed.  Patient to recheck with her primary care provider, return to the ER for any new or worsening symptoms.   [LM]    Clinical Course User Index [LM] Tacy Learn, PA-C   Final Clinical Impressions(s) / ED Diagnoses   Final diagnoses:  Influenza-like illness    ED Discharge Orders    None       Roque Lias 08/04/18 Si Raider, MD 08/05/18 2148

## 2018-08-04 NOTE — ED Notes (Signed)
Patient c/o weakness and dizziness started today.

## 2018-08-04 NOTE — ED Notes (Signed)
Patient transported to X-ray 

## 2018-08-04 NOTE — ED Triage Notes (Signed)
Cough since Saturday. Taking mucinex. Endorses weakness

## 2018-08-04 NOTE — Discharge Instructions (Addendum)
Take Motrin and Tylenol as needed as directed for fevers, chills. Continue with Mucinex as directed. Drink plenty of hydrating fluids like G2. Recheck with your doctor, return to ER for severe or concerning symptoms.

## 2018-08-04 NOTE — ED Notes (Signed)
ED Provider at bedside. 

## 2019-02-16 ENCOUNTER — Other Ambulatory Visit: Payer: Self-pay | Admitting: Internal Medicine

## 2019-02-16 DIAGNOSIS — Z20822 Contact with and (suspected) exposure to covid-19: Secondary | ICD-10-CM

## 2019-02-20 LAB — NOVEL CORONAVIRUS, NAA: SARS-CoV-2, NAA: NOT DETECTED

## 2019-07-13 ENCOUNTER — Other Ambulatory Visit: Payer: Self-pay

## 2019-07-13 DIAGNOSIS — Z20822 Contact with and (suspected) exposure to covid-19: Secondary | ICD-10-CM

## 2019-07-14 LAB — NOVEL CORONAVIRUS, NAA: SARS-CoV-2, NAA: NOT DETECTED

## 2019-10-07 ENCOUNTER — Ambulatory Visit: Payer: Self-pay | Attending: Internal Medicine

## 2019-10-07 DIAGNOSIS — Z20822 Contact with and (suspected) exposure to covid-19: Secondary | ICD-10-CM | POA: Insufficient documentation

## 2019-10-08 LAB — NOVEL CORONAVIRUS, NAA: SARS-CoV-2, NAA: NOT DETECTED

## 2020-03-03 ENCOUNTER — Ambulatory Visit: Payer: Self-pay | Attending: Internal Medicine

## 2020-03-03 DIAGNOSIS — Z23 Encounter for immunization: Secondary | ICD-10-CM

## 2020-03-03 NOTE — Progress Notes (Signed)
   Covid-19 Vaccination Clinic  Name:  Julia Sexton    MRN: [id number] DOB: 03/15/1956  03/03/2020  Julia Sexton was observed post Covid-19 immunization for 15 minutes without incident. She was provided with Vaccine Information Sheet and instruction to access the V-Safe system.   Julia Sexton was instructed to call 911 with any severe reactions post vaccine: Marland Kitchen Difficulty breathing  . Swelling of face and throat  . A fast heartbeat  . A bad rash all over body  . Dizziness and weakness   Immunizations Administered    Name Date Dose VIS Date Route   Pfizer COVID-19 Vaccine 03/03/2020  8:59 AM 0.3 mL 09/28/2018 Intramuscular   Manufacturer: [employer]   Lot: [id number]   [place]: [id number]

## 2020-04-03 ENCOUNTER — Ambulatory Visit: Payer: Self-pay | Attending: Internal Medicine

## 2020-04-03 DIAGNOSIS — Z23 Encounter for immunization: Secondary | ICD-10-CM

## 2020-04-03 NOTE — Progress Notes (Signed)
   Covid-19 Vaccination Clinic  Name:  Julia Sexton    MRN: [id number] DOB: [birth year]  04/03/2020  [name] was observed post Covid-19 immunization for 15 minutes without incident. She was provided with Vaccine Information Sheet and instruction to access the V-Safe system.   Ms. Sand was instructed to call 911 with any severe reactions post vaccine: Marland Kitchen Difficulty breathing  . Swelling of face and throat  . A fast heartbeat  . A bad rash all over body  . Dizziness and weakness   Immunizations Administered    Name Date Dose VIS Date Route   Pfizer COVID-19 Vaccine 04/03/2020  4:23 PM 0.3 mL 09/28/2018 Intramuscular   Manufacturer: [place]   Lot: [id number]   [place]: [id number]

## 2020-07-12 IMAGING — CR DG CHEST 2V
2 series · 2 of 2 positions shown · non-contrast
Comparison: July 29, 2015

CLINICAL DATA: Cough and fever for 5 days

EXAM:
CHEST - 2 VIEW

[w chest pa]
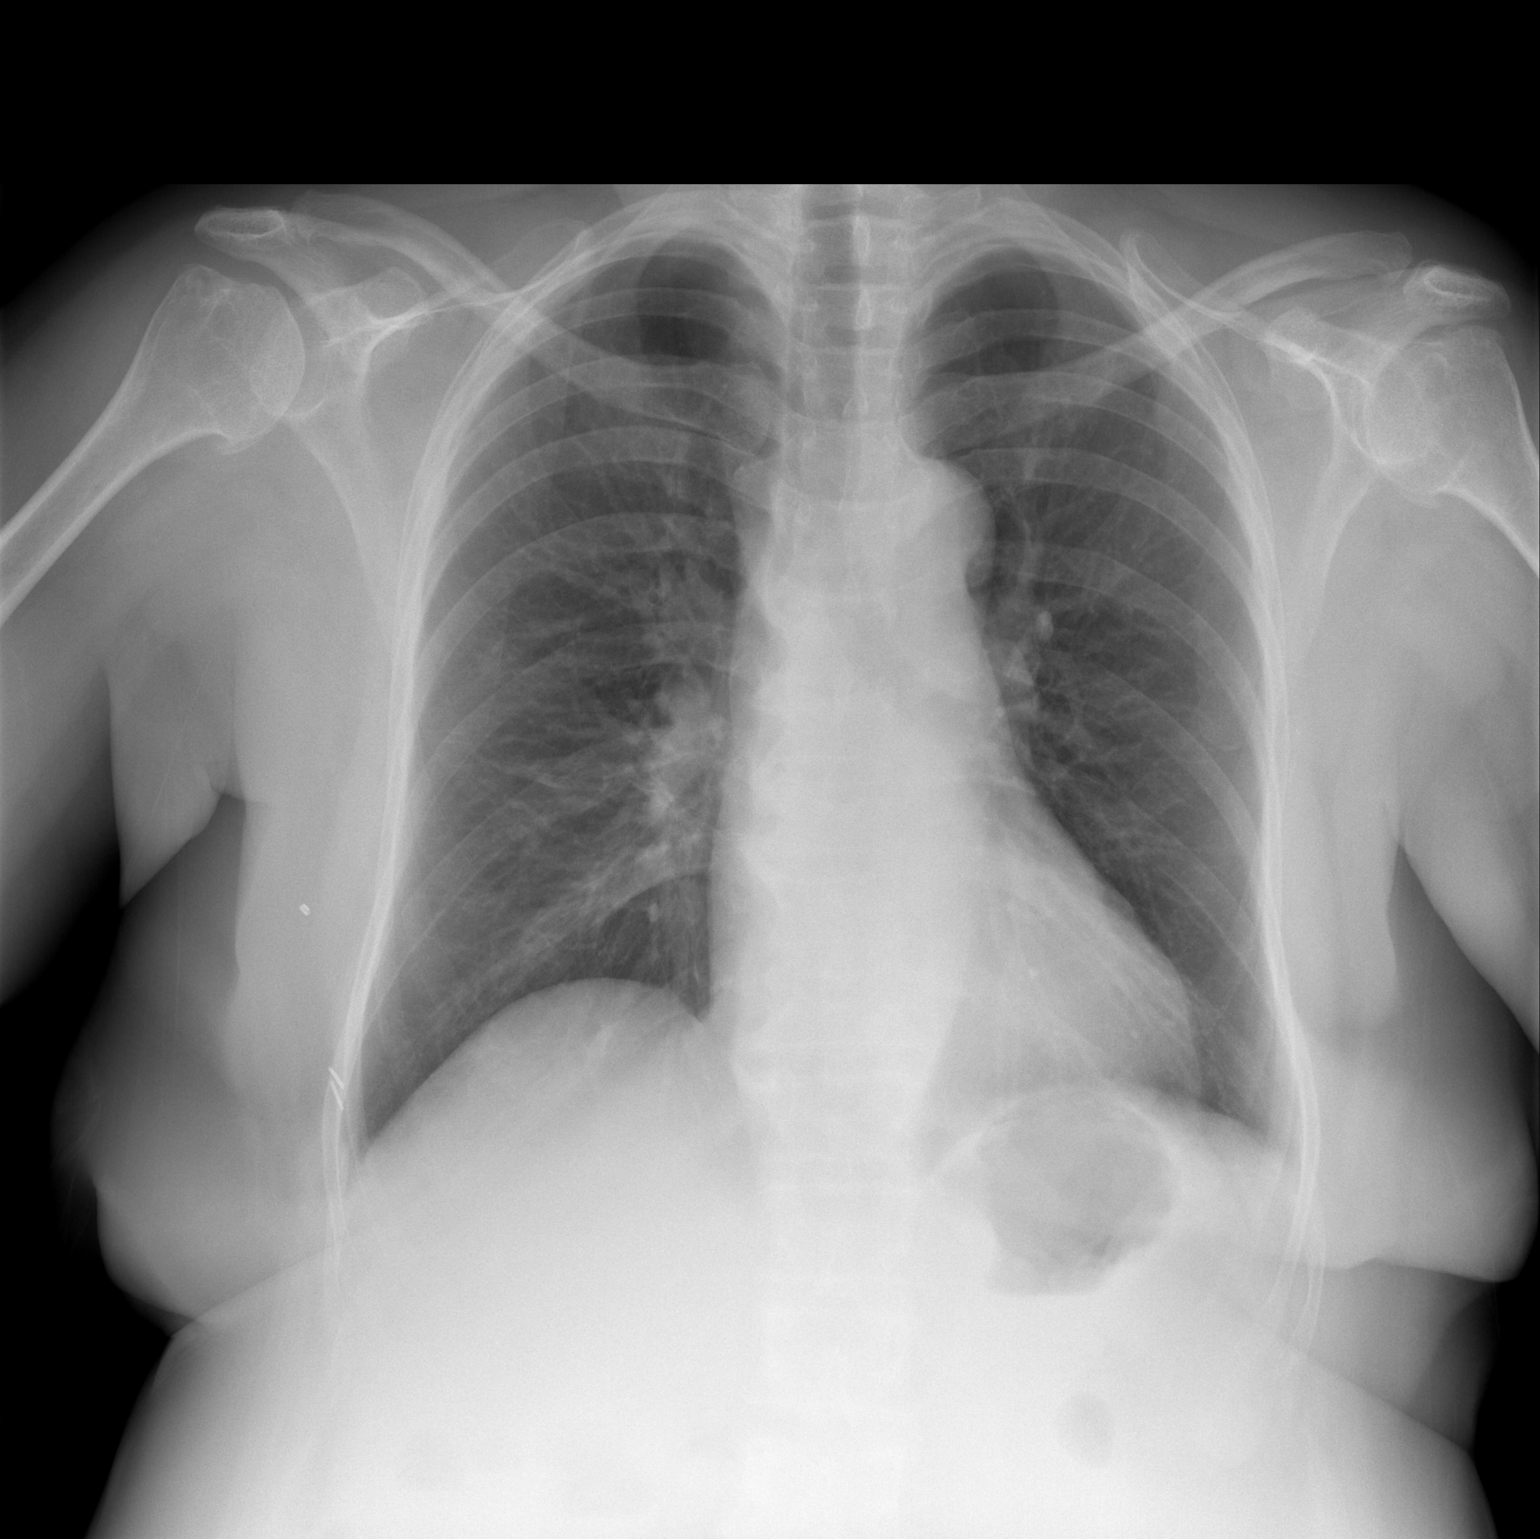

[w chest lat]
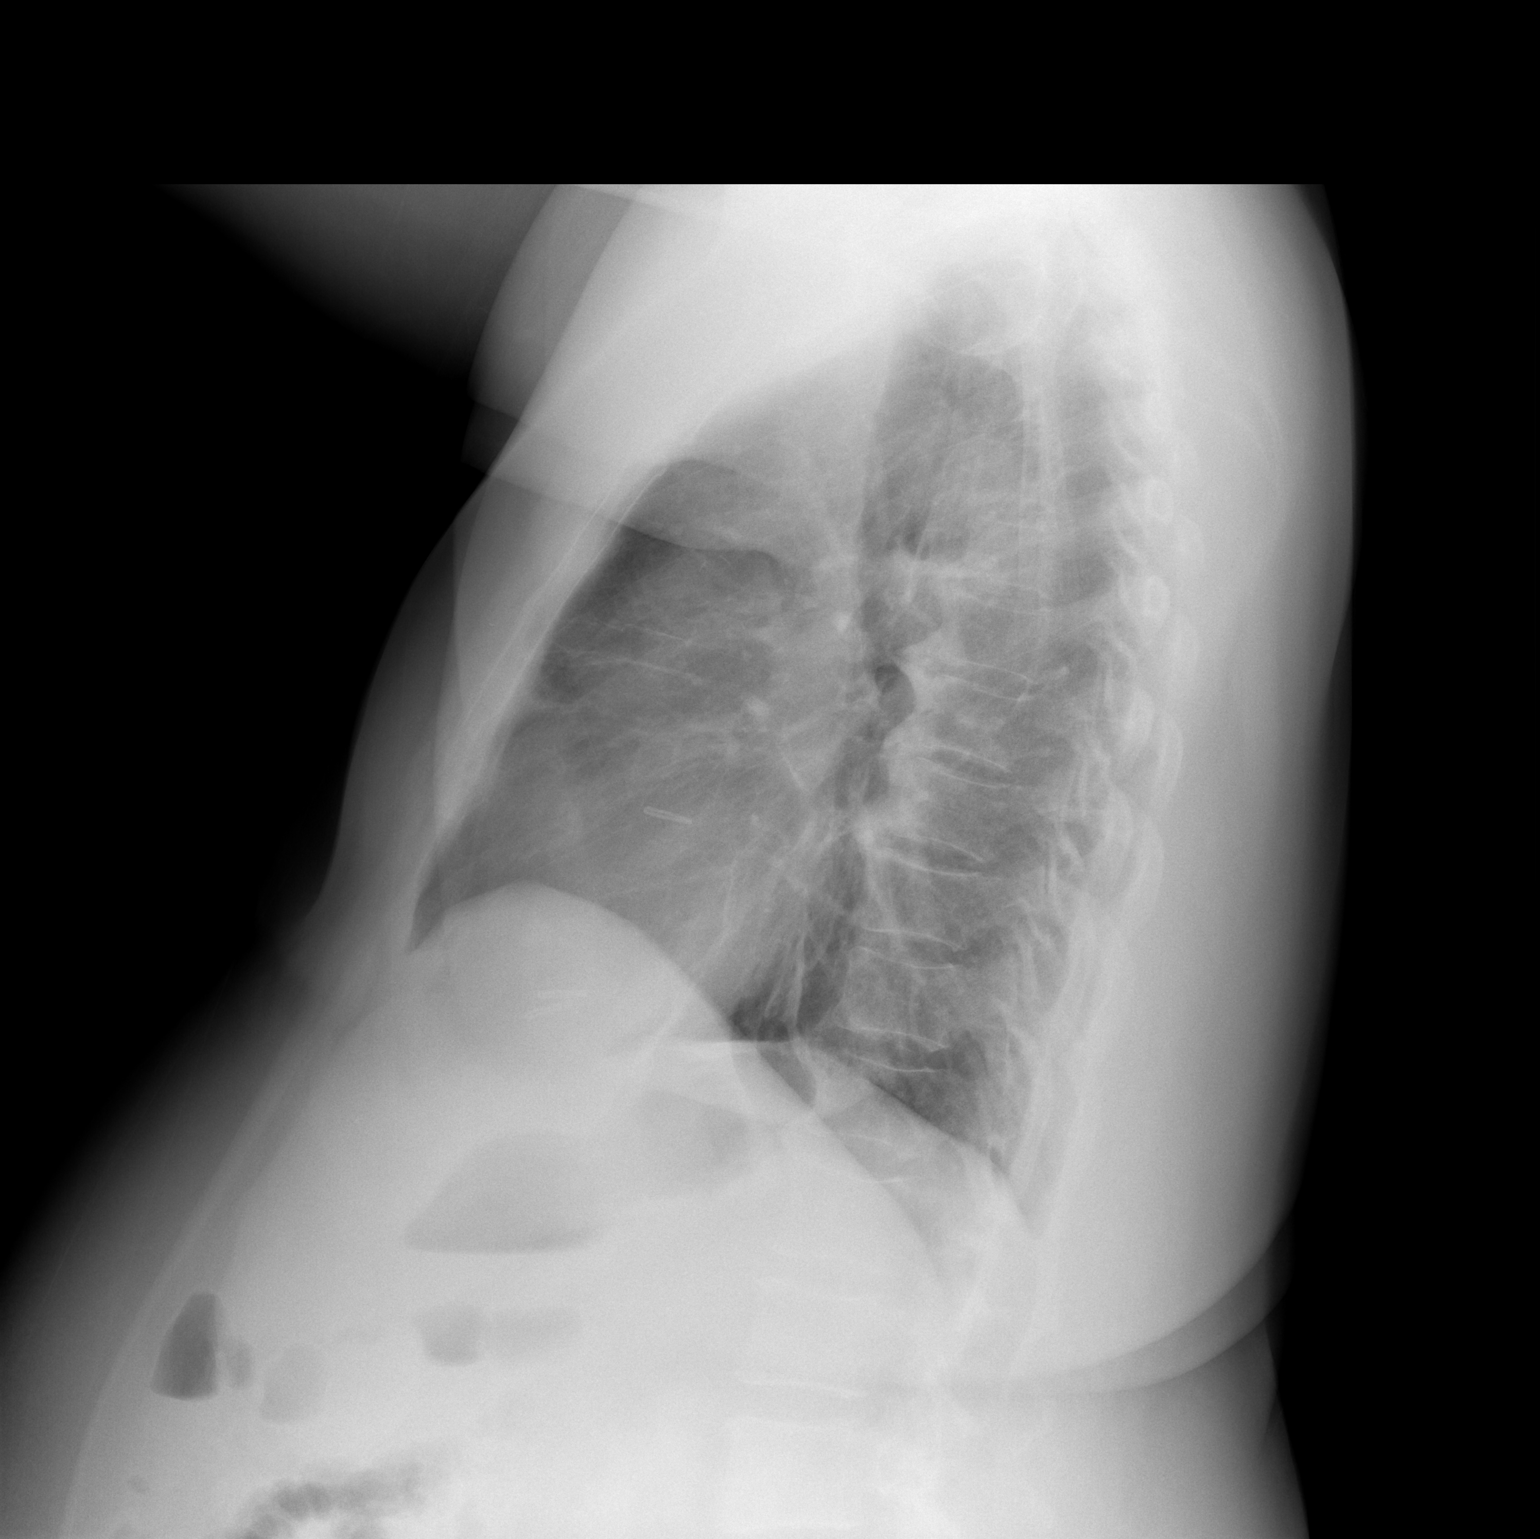

[2 of 2 positions shown; findings below may reference images not displayed]

FINDINGS: The heart size and mediastinal contours are within normal limits.
There is no focal infiltrate, pulmonary edema, or pleural effusion.
The visualized skeletal structures are stable. Surgical clips over
the right breasts is identified.
IMPRESSION: No active cardiopulmonary disease.

## 2020-09-04 ENCOUNTER — Other Ambulatory Visit: Payer: Self-pay

## 2020-09-04 ENCOUNTER — Emergency Department (HOSPITAL_BASED_OUTPATIENT_CLINIC_OR_DEPARTMENT_OTHER): Payer: Medicare HMO

## 2020-09-04 ENCOUNTER — Emergency Department (HOSPITAL_BASED_OUTPATIENT_CLINIC_OR_DEPARTMENT_OTHER)
Admission: EM | Admit: 2020-09-04 | Discharge: 2020-09-04 | Disposition: A | Discharge status: Home / Self Care | Payer: Medicare HMO | Attending: Emergency Medicine | Admitting: Emergency Medicine

## 2020-09-04 ENCOUNTER — Encounter (HOSPITAL_BASED_OUTPATIENT_CLINIC_OR_DEPARTMENT_OTHER): Payer: Self-pay | Admitting: Emergency Medicine

## 2020-09-04 DIAGNOSIS — R072 Precordial pain: Secondary | ICD-10-CM | POA: Diagnosis not present

## 2020-09-04 DIAGNOSIS — I10 Essential (primary) hypertension: Secondary | ICD-10-CM | POA: Insufficient documentation

## 2020-09-04 DIAGNOSIS — Z20822 Contact with and (suspected) exposure to covid-19: Secondary | ICD-10-CM | POA: Insufficient documentation

## 2020-09-04 DIAGNOSIS — E119 Type 2 diabetes mellitus without complications: Secondary | ICD-10-CM | POA: Insufficient documentation

## 2020-09-04 DIAGNOSIS — R059 Cough, unspecified: Secondary | ICD-10-CM | POA: Diagnosis not present

## 2020-09-04 DIAGNOSIS — R079 Chest pain, unspecified: Secondary | ICD-10-CM | POA: Diagnosis present

## 2020-09-04 DIAGNOSIS — R197 Diarrhea, unspecified: Secondary | ICD-10-CM | POA: Diagnosis not present

## 2020-09-04 LAB — TROPONIN I (HIGH SENSITIVITY)
Troponin I (High Sensitivity): 3 ng/L (ref <18)
Troponin I (High Sensitivity): 2 ng/L (ref <18)

## 2020-09-04 LAB — CBC
HCT: 42.7 % (ref 36.0–46.0)
Hemoglobin: 13.7 g/dL (ref 12.0–15.0)
MCH: 27.7 pg (ref 26.0–34.0)
MCHC: 32.1 g/dL (ref 30.0–36.0)
MCV: 86.3 fL (ref 80.0–100.0)
Platelets: 295 10*3/uL (ref 150–400)
RBC: 4.95 MIL/uL (ref 3.87–5.11)
RDW: 13.9 % (ref 11.5–15.5)
WBC: 8.4 10*3/uL (ref 4.0–10.5)
nRBC: 0 % (ref 0.0–0.2)

## 2020-09-04 LAB — BASIC METABOLIC PANEL
Anion gap: 9 (ref 5–15)
BUN: 14 mg/dL (ref 8–23)
CO2: 30 mmol/L (ref 22–32)
Calcium: 9.7 mg/dL (ref 8.9–10.3)
Chloride: 98 mmol/L (ref 98–111)
Creatinine, Ser: 1 mg/dL (ref 0.44–1.00)
GFR, Estimated: >60 mL/min (ref >60)
Glucose, Bld: 137 mg/dL — ABNORMAL HIGH (ref 70–99)
Potassium: 3.3 mmol/L — ABNORMAL LOW (ref 3.5–5.1)
Sodium: 137 mmol/L (ref 135–145)

## 2020-09-04 LAB — SARS CORONAVIRUS 2 (TAT 6-24 HRS): SARS Coronavirus 2: NEGATIVE

## 2020-09-04 NOTE — ED Provider Notes (Signed)
Emergency Department Provider Note   I have reviewed the triage vital signs and the nursing notes.   HISTORY  Chief Complaint Chest Pain   HPI Julia Sexton is a 65 y.o. female with past medical history of hypertension and diabetes presents to the emergency department for evaluation of chest pain starting yesterday.  She denies fever but notes cough over the past several days.  She has baseline diarrhea which is unchanged.  No nausea or vomiting.  No abdominal discomfort.  Her chest pain is relatively mild and constant.  She describes a central, mild tightness.  No radiation of symptoms or other modifying factors.  No diaphoresis.  No syncope or near syncope symptoms.  No new medication.     Past Medical History:  Diagnosis Date  . Diabetes mellitus without complication ([place])   . Hypertension     Patient Active Problem List   Diagnosis Date Noted  . Essential hypertension 10/31/2014  . Hyperthyroidism 10/31/2014  . Type 2 diabetes mellitus ([place]) 10/31/2014  . Chest pain 10/30/2014    Past Surgical History:  Procedure Laterality Date  . CYST EXCISION      Allergies Patient has no known allergies.  Family History  Problem Relation Age of Onset  . Kidney disease Father        deceased  . Diabetes Mother        deceased  . Diabetes Sister   . Hypertension Sister   . Gout Sister   . Diabetes Sister   . Hypertension Sister   . Asthma Son   . GER disease Son   . GER disease Son   . Asthma Son     Social History Social History   Tobacco Use  . Smoking status: Never Smoker  . Smokeless tobacco: Never Used  Substance Use Topics  . Alcohol use: No  . Drug use: No    Review of Systems  Constitutional: No fever/chills Eyes: No visual changes. ENT: No sore throat. Cardiovascular: Positive chest pain. Respiratory: Denies shortness of breath. Positive cough.  Gastrointestinal: No abdominal pain.  No nausea, no vomiting. Chronic intermittent diarrhea.  No  constipation. Genitourinary: Negative for dysuria. Musculoskeletal: Negative for back pain. Skin: Negative for rash. Neurological: Negative for headaches, focal weakness or numbness.  10-point ROS otherwise negative.  ____________________________________________   PHYSICAL EXAM:  VITAL SIGNS: ED Triage Vitals  Enc Vitals Group     BP 09/04/20 0905 (!) 147/86     Pulse Rate 09/04/20 0905 93     Resp 09/04/20 0905 18     Temp 09/04/20 0907 98.5 F (36.9 C)     Temp Source 09/04/20 0905 Oral     SpO2 09/04/20 0905 97 %     Weight 09/04/20 0901 184 lb (83.5 kg)     Height 09/04/20 0901 5\' 3"  (1.6 m)   Constitutional: Alert and oriented. Well appearing and in no acute distress. Eyes: Conjunctivae are normal. Head: Atraumatic. Nose: No congestion/rhinnorhea. Mouth/Throat: Mucous membranes are moist. Neck: No stridor.   Cardiovascular: Normal rate, regular rhythm. Good peripheral circulation. Grossly normal heart sounds.   Respiratory: Normal respiratory effort.  No retractions. Lungs CTAB. Gastrointestinal: Soft and nontender. No distention.  Musculoskeletal: No lower extremity tenderness nor edema. No gross deformities of extremities. Neurologic:  Normal speech and language. No gross focal neurologic deficits are appreciated.  Skin:  Skin is warm, dry and intact. No rash noted.   ____________________________________________   LABS (all labs ordered are listed, but  only abnormal results are displayed)  Labs Reviewed  BASIC METABOLIC PANEL - Abnormal; Notable for the following components:      Result Value   Potassium 3.3 (*)    Glucose, Bld 137 (*)    All other components within normal limits  SARS CORONAVIRUS 2 (TAT 6-24 HRS)  CBC  TROPONIN I (HIGH SENSITIVITY)  TROPONIN I (HIGH SENSITIVITY)   ____________________________________________  EKG   EKG Interpretation  Date/Time:  Tuesday September 04 2020 08:56:35 EST Ventricular Rate:  82 PR Interval:  168 QRS  Duration: 96 QT Interval:  362 QTC Calculation: 422 R Axis:   98 Text Interpretation: Normal sinus rhythm Right atrial enlargement Pulmonary disease pattern Incomplete right bundle branch block Similar to prior. Confirmed by Nanda Quinton [phone]) on 09/04/2020 9:07:54 AM       ____________________________________________  RADIOLOGY  DG Chest Portable 1 View  Result Date: 09/04/2020 CLINICAL DATA:  Chest pain, cough EXAM: PORTABLE CHEST 1 VIEW COMPARISON:  08/04/2018 FINDINGS: Mild cardiomegaly. Both lungs are clear. The visualized skeletal structures are unremarkable. IMPRESSION: Mild cardiomegaly without acute abnormality of the lungs in AP portable projection. Electronically Signed   By: Eddie Candle M.D.   On: 09/04/2020 09:59    ____________________________________________   PROCEDURES  Procedure(s) performed:   Procedures  None ____________________________________________   INITIAL IMPRESSION / ASSESSMENT AND PLAN / ED COURSE  Pertinent labs & imaging results that were available during my care of the patient were reviewed by me and considered in my medical decision making (see chart for details).   Patient presents emergency department for evaluation of chest discomfort.  She has some mild heaviness since yesterday.  No fever but has had some cough.  Will ultimately need Covid testing but will trend troponins to decide if she needs the 2-hour PCR versus the 6 to 24-hour PCR.  Chest x-ray shows some cardiomegaly but no other acute findings.  Patient does not appear acutely volume overloaded on exam.  No acute distress with largely unremarkable vital signs.   Delta troponin negative. Patient to follow COVID results in [place] app. Discussed strict ED return precautions, quarantine per CDC recommendation, and PCP follow up plan.    Julia Sexton was evaluated in Emergency Department on 09/04/2020 for the symptoms described in the history of present illness. She was evaluated in  the context of the global COVID-19 pandemic, which necessitated consideration that the patient might be at risk for infection with the SARS-CoV-2 virus that causes COVID-19. Institutional protocols and algorithms that pertain to the evaluation of patients at risk for COVID-19 are in a state of rapid change based on information released by regulatory bodies including the CDC and federal and state organizations. These policies and algorithms were followed during the patient's care in the ED.  ____________________________________________  FINAL CLINICAL IMPRESSION(S) / ED DIAGNOSES  Final diagnoses:  Precordial chest pain  Cough    Note:  This document was prepared using Dragon voice recognition software and may include unintentional dictation errors.  Nanda Quinton, MD, [facility] Emergency Medicine    Crystelle Ferrufino, [facility], MD 09/04/20 [phone]

## 2020-09-04 NOTE — Discharge Instructions (Signed)
You were seen in the emergency department today with chest discomfort and cough.  Your Covid test will be sent out and available on your phone in the next 6 to 24 hours.  Please download and activate the MyChart app if you do not already have this on your phone.  There is information in the discharge paperwork about how to do this.  Please call your primary care doctor today to schedule a follow-up appointment to review your home medications along with ED visit.  If you develop new or suddenly worsening symptoms please return to the emergency department immediately for reevaluation.

## 2020-09-04 NOTE — ED Triage Notes (Addendum)
Pt arrives pov with c/o CP that pt reports as starting yesterday. Pt denies fever, endorses cough x "couple days"

## 2020-12-17 ENCOUNTER — Emergency Department (HOSPITAL_BASED_OUTPATIENT_CLINIC_OR_DEPARTMENT_OTHER): Payer: Medicare HMO

## 2020-12-17 ENCOUNTER — Encounter (HOSPITAL_BASED_OUTPATIENT_CLINIC_OR_DEPARTMENT_OTHER): Payer: Self-pay

## 2020-12-17 ENCOUNTER — Emergency Department (HOSPITAL_BASED_OUTPATIENT_CLINIC_OR_DEPARTMENT_OTHER)
Admission: EM | Admit: 2020-12-17 | Discharge: 2020-12-17 | Disposition: A | Discharge status: Home / Self Care | Payer: Medicare HMO | Attending: Emergency Medicine | Admitting: Emergency Medicine

## 2020-12-17 ENCOUNTER — Other Ambulatory Visit: Payer: Self-pay

## 2020-12-17 DIAGNOSIS — I10 Essential (primary) hypertension: Secondary | ICD-10-CM | POA: Insufficient documentation

## 2020-12-17 DIAGNOSIS — T148XXA Other injury of unspecified body region, initial encounter: Secondary | ICD-10-CM

## 2020-12-17 DIAGNOSIS — E119 Type 2 diabetes mellitus without complications: Secondary | ICD-10-CM | POA: Diagnosis not present

## 2020-12-17 DIAGNOSIS — W010XXA Fall on same level from slipping, tripping and stumbling without subsequent striking against object, initial encounter: Secondary | ICD-10-CM | POA: Insufficient documentation

## 2020-12-17 DIAGNOSIS — S86911A Strain of unspecified muscle(s) and tendon(s) at lower leg level, right leg, initial encounter: Secondary | ICD-10-CM | POA: Insufficient documentation

## 2020-12-17 DIAGNOSIS — Y9301 Activity, walking, marching and hiking: Secondary | ICD-10-CM | POA: Insufficient documentation

## 2020-12-17 DIAGNOSIS — Z79899 Other long term (current) drug therapy: Secondary | ICD-10-CM | POA: Insufficient documentation

## 2020-12-17 DIAGNOSIS — Z853 Personal history of malignant neoplasm of breast: Secondary | ICD-10-CM | POA: Insufficient documentation

## 2020-12-17 DIAGNOSIS — M25561 Pain in right knee: Secondary | ICD-10-CM

## 2020-12-17 DIAGNOSIS — S8991XA Unspecified injury of right lower leg, initial encounter: Secondary | ICD-10-CM | POA: Diagnosis present

## 2020-12-17 DIAGNOSIS — R059 Cough, unspecified: Secondary | ICD-10-CM | POA: Diagnosis not present

## 2020-12-17 DIAGNOSIS — Z7984 Long term (current) use of oral hypoglycemic drugs: Secondary | ICD-10-CM | POA: Diagnosis not present

## 2020-12-17 HISTORY — DX: Malignant (primary) neoplasm, unspecified: C80.1

## 2020-12-17 NOTE — ED Triage Notes (Signed)
Pt arrives ambulatory to room with slight limp with c/o pain to right leg after slipping and falling yesterday on wet grass while walking her dog. Pt reports she has taken 2 extra strength tylenol, used a hitting pad, and Bengay but continues to have pain in back of her right leg/ knee area with some pain in ankle.

## 2020-12-17 NOTE — ED Provider Notes (Signed)
[facility] EMERGENCY DEPARTMENT Provider Note   CSN: [id number] Arrival date & time: 12/17/20  1638     History Chief Complaint  Patient presents with  . Fall    Julia Sexton is a 65 y.o. female.  She is here with a complaint of right knee pain after slipping and falling yesterday walking her dog.  Pain is primarily located behind the knee and is worse with any weightbearing.  No numbness or weakness.  She is also had a cough for about a week.  Nonproductive.  No fevers or chills.  The history is provided by the patient.  Fall This is a new problem. The current episode started yesterday. The problem occurs constantly. The problem has not changed since onset.Pertinent negatives include no chest pain, no abdominal pain, no headaches and no shortness of breath. The symptoms are aggravated by walking. Nothing relieves the symptoms. She has tried acetaminophen for the symptoms. The treatment provided mild relief.       Past Medical History:  Diagnosis Date  . Cancer (HCC)    Breast  . Diabetes mellitus without complication ([place])   . Hypertension     Patient Active Problem List   Diagnosis Date Noted  . Essential hypertension 10/31/2014  . Hyperthyroidism 10/31/2014  . Type 2 diabetes mellitus ([place]) 10/31/2014  . Chest pain 10/30/2014    Past Surgical History:  Procedure Laterality Date  . CYST EXCISION    . MASTECTOMY     double      OB History   No obstetric history on file.     Family History  Problem Relation Age of Onset  . Kidney disease Father        deceased  . Diabetes Mother        deceased  . Diabetes Sister   . Hypertension Sister   . Gout Sister   . Diabetes Sister   . Hypertension Sister   . Asthma Son   . GER disease Son   . GER disease Son   . Asthma Son     Social History   Tobacco Use  . Smoking status: Never Smoker  . Smokeless tobacco: Never Used  Substance Use Topics  . Alcohol use: No  . Drug use: No    Home  Medications Prior to Admission medications   Medication Sig Start Date End Date Taking? Authorizing Provider  amLODipine (NORVASC) 10 MG tablet Take 1 tablet by mouth daily. 09/14/20   [provider]  glipiZIDE (GLUCOTROL) 10 MG tablet Take 10 mg by mouth daily.     [provider]  hydrochlorothiazide (HYDRODIURIL) 25 MG tablet Take 25 mg by mouth daily.    [provider]  lisinopril (PRINIVIL,ZESTRIL) 40 MG tablet Take 40 mg by mouth daily.    [provider]  metFORMIN (GLUCOPHAGE) 500 MG tablet Take 1,000 mg by mouth 2 (two) times daily with a meal.     [provider]  metoprolol succinate (TOPROL-XL) 50 MG 24 hr tablet Take 200 mg by mouth daily. Take with or immediately following a meal.    [provider]    Allergies    Patient has no known allergies.  Review of Systems   Review of Systems  Respiratory: Positive for cough. Negative for shortness of breath.   Cardiovascular: Negative for chest pain and leg swelling.  Gastrointestinal: Negative for abdominal pain.  Skin: Negative for wound.  Neurological: Negative for headaches.    Physical Exam Updated  Vital Signs BP (!) 155/93   Pulse 78   Temp 98.4 F (36.9 C) (Oral)   Resp 16   Ht 5\' 3"  (1.6 m)   Wt 84.8 kg   SpO2 95%   BMI 33.13 kg/m   Physical Exam Vitals and nursing note reviewed.  Constitutional:      General: She is not in acute distress.    Appearance: Normal appearance. She is well-developed.  HENT:     Head: Normocephalic and atraumatic.  Eyes:     Conjunctiva/sclera: Conjunctivae normal.  Cardiovascular:     Rate and Rhythm: Normal rate and regular rhythm.     Heart sounds: No murmur heard.   Pulmonary:     Effort: Pulmonary effort is normal. No respiratory distress.     Breath sounds: Normal breath sounds. No stridor. No wheezing.  Abdominal:     Palpations: Abdomen is soft.     Tenderness: There is no abdominal tenderness.   Musculoskeletal:        General: Tenderness present. Normal range of motion.     Cervical back: Neck supple.     Comments: She has some tenderness behind the left knee on the hamstring.  Ligaments stable.  No joint swelling of right knee or ankle.  Distal neurovascular intact.  Skin:    General: Skin is warm and dry.     Capillary Refill: Capillary refill takes less than 2 seconds.  Neurological:     General: No focal deficit present.     Mental Status: She is alert.     GCS: GCS eye subscore is 4. GCS verbal subscore is 5. GCS motor subscore is 6.     ED Results / Procedures / Treatments   Labs (all labs ordered are listed, but only abnormal results are displayed) Labs Reviewed - No data to display  EKG None  Radiology DG Chest 2 View  Result Date: 12/17/2020 CLINICAL DATA:  Fall, pain EXAM: CHEST - 2 VIEW COMPARISON:  09/04/2020 FINDINGS: Heart and mediastinal contours are within normal limits. No focal opacities or effusions. No acute bony abnormality. IMPRESSION: No active cardiopulmonary disease. Electronically Signed   By: Rolm Baptise M.D.   On: 12/17/2020 10:54   DG Knee Complete 4 Views Right  Result Date: 12/17/2020 CLINICAL DATA:  Fall, pain EXAM: RIGHT KNEE - COMPLETE 4+ VIEW COMPARISON:  None. FINDINGS: Degenerative changes in the right knee with joint space narrowing and spurring most pronounced in the patellofemoral compartment. No joint effusion. No acute bony abnormality. Specifically, no fracture, subluxation, or dislocation. IMPRESSION: No acute bony abnormality. Electronically Signed   By: Rolm Baptise M.D.   On: 12/17/2020 10:54    Procedures Procedures   Medications Ordered in ED Medications - No data to display  ED Course  I have reviewed the triage vital signs and the nursing notes.  Pertinent labs & imaging results that were available during my care of the patient were reviewed by me and considered in my medical decision making (see chart for  details).  Clinical Course as of 12/17/20 1833  Mon Dec 17, 2020  1100 X-ray of right knee and chest x-ray do not show any acute findings.  Reviewed with patient.  Recommended symptomatic treatment.  Return instructions discussed [MB]    Clinical Course User Index [MB] Hayden Rasmussen, MD   MDM Rules/Calculators/A&P  Patient is here with right knee pain after a fall and more longstanding cough.  Differential includes viral syndrome, allergies, pneumonia, fracture, dislocation, meniscal injury, musculoskeletal injury.  X-rays do not show any acute findings.  Recommended symptomatic treatment at home and return instructions discussed. Final Clinical Impression(s) / ED Diagnoses Final diagnoses:  Acute pain of right knee  Muscle strain  Cough    Rx / DC Orders ED Discharge Orders    None       Hayden Rasmussen, MD 12/17/20 [phone]

## 2022-05-28 ENCOUNTER — Emergency Department (HOSPITAL_COMMUNITY): Payer: Medicare Other

## 2022-05-28 ENCOUNTER — Encounter (HOSPITAL_COMMUNITY): Payer: Self-pay | Admitting: Emergency Medicine

## 2022-05-28 ENCOUNTER — Emergency Department (HOSPITAL_COMMUNITY)
Admission: EM | Admit: 2022-05-28 | Discharge: 2022-05-29 | Discharge status: Against Medical Advice | Payer: Medicare Other | Attending: Emergency Medicine | Admitting: Emergency Medicine

## 2022-05-28 DIAGNOSIS — Z5321 Procedure and treatment not carried out due to patient leaving prior to being seen by health care provider: Secondary | ICD-10-CM | POA: Diagnosis not present

## 2022-05-28 DIAGNOSIS — R079 Chest pain, unspecified: Secondary | ICD-10-CM | POA: Insufficient documentation

## 2022-05-28 LAB — BASIC METABOLIC PANEL
Anion gap: 13 (ref 5–15)
BUN: 13 mg/dL (ref 8–23)
CO2: 24 mmol/L (ref 22–32)
Calcium: 9.4 mg/dL (ref 8.9–10.3)
Chloride: 103 mmol/L (ref 98–111)
Creatinine, Ser: 0.78 mg/dL (ref 0.44–1.00)
GFR, Estimated: >60 mL/min (ref >60)
Glucose, Bld: 141 mg/dL — ABNORMAL HIGH (ref 70–99)
Potassium: 3.5 mmol/L (ref 3.5–5.1)
Sodium: 140 mmol/L (ref 135–145)

## 2022-05-28 LAB — CBC
HCT: 40.4 % (ref 36.0–46.0)
Hemoglobin: 12.5 g/dL (ref 12.0–15.0)
MCH: 27.2 pg (ref 26.0–34.0)
MCHC: 30.9 g/dL (ref 30.0–36.0)
MCV: 88 fL (ref 80.0–100.0)
Platelets: 255 10*3/uL (ref 150–400)
RBC: 4.59 MIL/uL (ref 3.87–5.11)
RDW: 13.2 % (ref 11.5–15.5)
WBC: 8.3 10*3/uL (ref 4.0–10.5)
nRBC: 0 % (ref 0.0–0.2)

## 2022-05-28 LAB — TROPONIN I (HIGH SENSITIVITY): Troponin I (High Sensitivity): 5 ng/L (ref <18)

## 2022-05-28 NOTE — ED Triage Notes (Signed)
PER EMS: pt picked up from Urgent Care due to chest pain since Monday worsened with movement and inspiration. She states her pain is located under her left arm that radiates to her stomach. She received 324 aspirin at urgent care.   BP- 193/109, HR-78, 99% RA, CBG-127

## 2022-05-28 NOTE — ED Notes (Signed)
Pt stated she was leaving because she has a doctor appointment tomorrow to discuss her getting a colonoscopy

## 2022-05-28 NOTE — ED Triage Notes (Signed)
Pt states she has been having left sided pain for a few days that worsened today. Denies SOB, n/v.

## 2022-05-28 NOTE — ED Provider Triage Note (Signed)
Emergency Medicine Provider Triage Evaluation Note  Julia Sexton , a 66 y.o. female  was evaluated in triage.  Pt complains of chest pain over the last 2 to 3 days.  Described as intermittent, sharp, and "twinging".  No Hx of MI or significant cardiac history.  Pain does not radiate and is located directly underneath the armpit on the left side of the chest.  Has been coughing over the last few weeks, no recent diagnosis of pneumonia or other significant URI symptoms.  Denies sore throat, fever, chills, N/V/D, back pain, lower leg weakness.  Review of Systems  Positive: See Negative: Above  Physical Exam  BP (!) 154/83 (BP Location: Left Arm)   Pulse 65   Temp 99.2 F (37.3 C) (Oral)   Resp 18   Ht '5\' 3"'$  (1.6 m)   Wt 85.7 kg   SpO2 97%   BMI 33.48 kg/m  Gen:   Awake, no distress   Resp:  Normal effort, CTAB, equal chest rise MSK:   Moves extremities without difficulty  Other:  Chest non-TTP.  Area located mid axillary line of the left chest wall with mild tenderness.  Medical Decision Making  Medically screening exam initiated at 6:24 PM.  Appropriate orders placed.  Lynnell Dike was informed that the remainder of the evaluation will be completed by another provider, this initial triage assessment does not replace that evaluation, and the importance of remaining in the ED until their evaluation is complete.     Prince Rome, PA-C 79/02/40 1825

## 2022-07-06 ENCOUNTER — Encounter (HOSPITAL_BASED_OUTPATIENT_CLINIC_OR_DEPARTMENT_OTHER): Payer: Self-pay | Admitting: Emergency Medicine

## 2022-07-06 ENCOUNTER — Emergency Department (HOSPITAL_BASED_OUTPATIENT_CLINIC_OR_DEPARTMENT_OTHER): Payer: Medicare Other

## 2022-07-06 ENCOUNTER — Emergency Department (HOSPITAL_BASED_OUTPATIENT_CLINIC_OR_DEPARTMENT_OTHER)
Admission: EM | Admit: 2022-07-06 | Discharge: 2022-07-06 | Discharge status: Against Medical Advice | Payer: Medicare Other | Attending: Emergency Medicine | Admitting: Emergency Medicine

## 2022-07-06 ENCOUNTER — Other Ambulatory Visit: Payer: Self-pay

## 2022-07-06 DIAGNOSIS — R058 Other specified cough: Secondary | ICD-10-CM | POA: Insufficient documentation

## 2022-07-06 DIAGNOSIS — Z5321 Procedure and treatment not carried out due to patient leaving prior to being seen by health care provider: Secondary | ICD-10-CM | POA: Insufficient documentation

## 2022-07-06 DIAGNOSIS — R059 Cough, unspecified: Secondary | ICD-10-CM | POA: Insufficient documentation

## 2022-07-06 DIAGNOSIS — Z1152 Encounter for screening for COVID-19: Secondary | ICD-10-CM | POA: Insufficient documentation

## 2022-07-06 LAB — RESP PANEL BY RT-PCR (FLU A&B, COVID) ARPGX2
Influenza A by PCR: NEGATIVE
Influenza B by PCR: NEGATIVE
SARS Coronavirus 2 by RT PCR: NEGATIVE

## 2022-07-06 NOTE — ED Triage Notes (Signed)
Pt arrives pov, c/o cough x 5 days, worsening x 3 days, Pt denies fever

## 2022-08-13 IMAGING — DX DG CHEST 1V PORT
1 series · 1 of 1 positions shown · non-contrast
Comparison: 08/04/2018

CLINICAL DATA: Chest pain, cough

EXAM:
PORTABLE CHEST 1 VIEW

[chest ap]
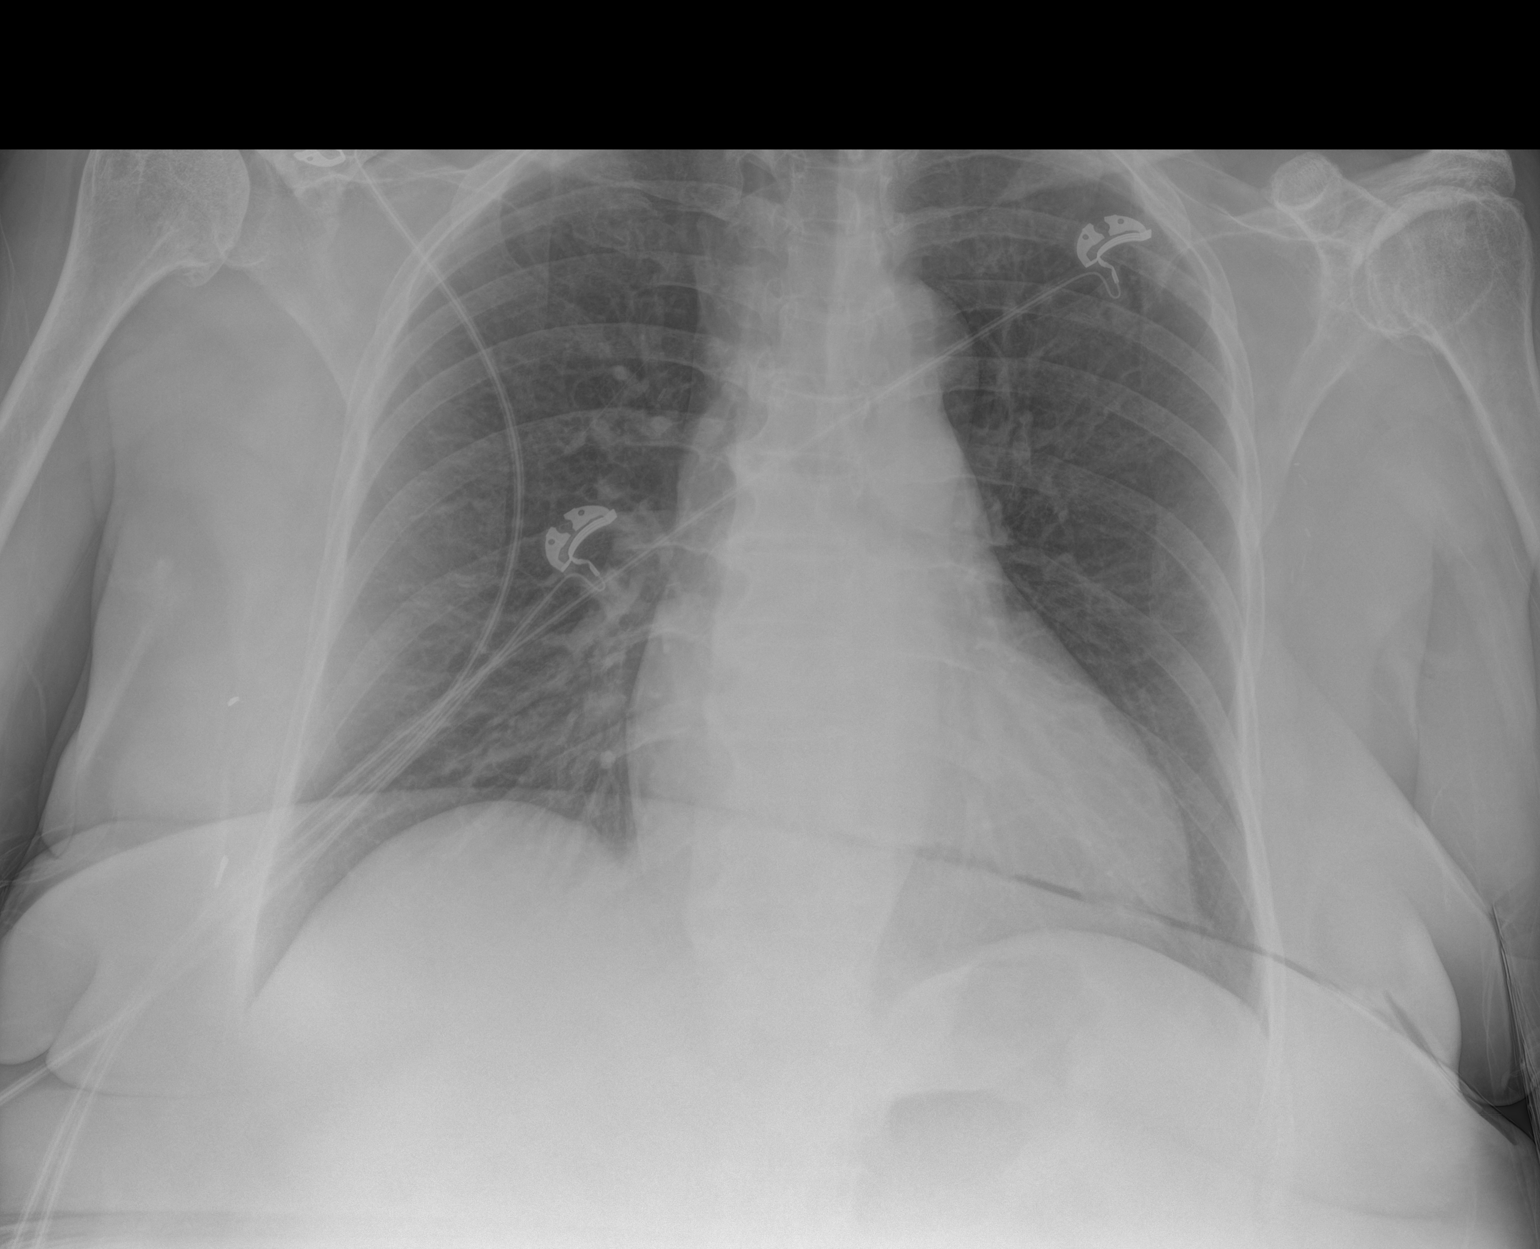

[1 of 1 positions shown; findings below may reference images not displayed]

FINDINGS: Mild cardiomegaly. Both lungs are clear. The visualized skeletal
structures are unremarkable.
IMPRESSION: Mild cardiomegaly without acute abnormality of the lungs in AP
portable projection.

## 2022-11-25 IMAGING — CR DG CHEST 2V
2 series · 2 of 2 positions shown · non-contrast
Comparison: 09/04/2020

CLINICAL DATA: Fall, pain

EXAM:
CHEST - 2 VIEW

[w chest pa]
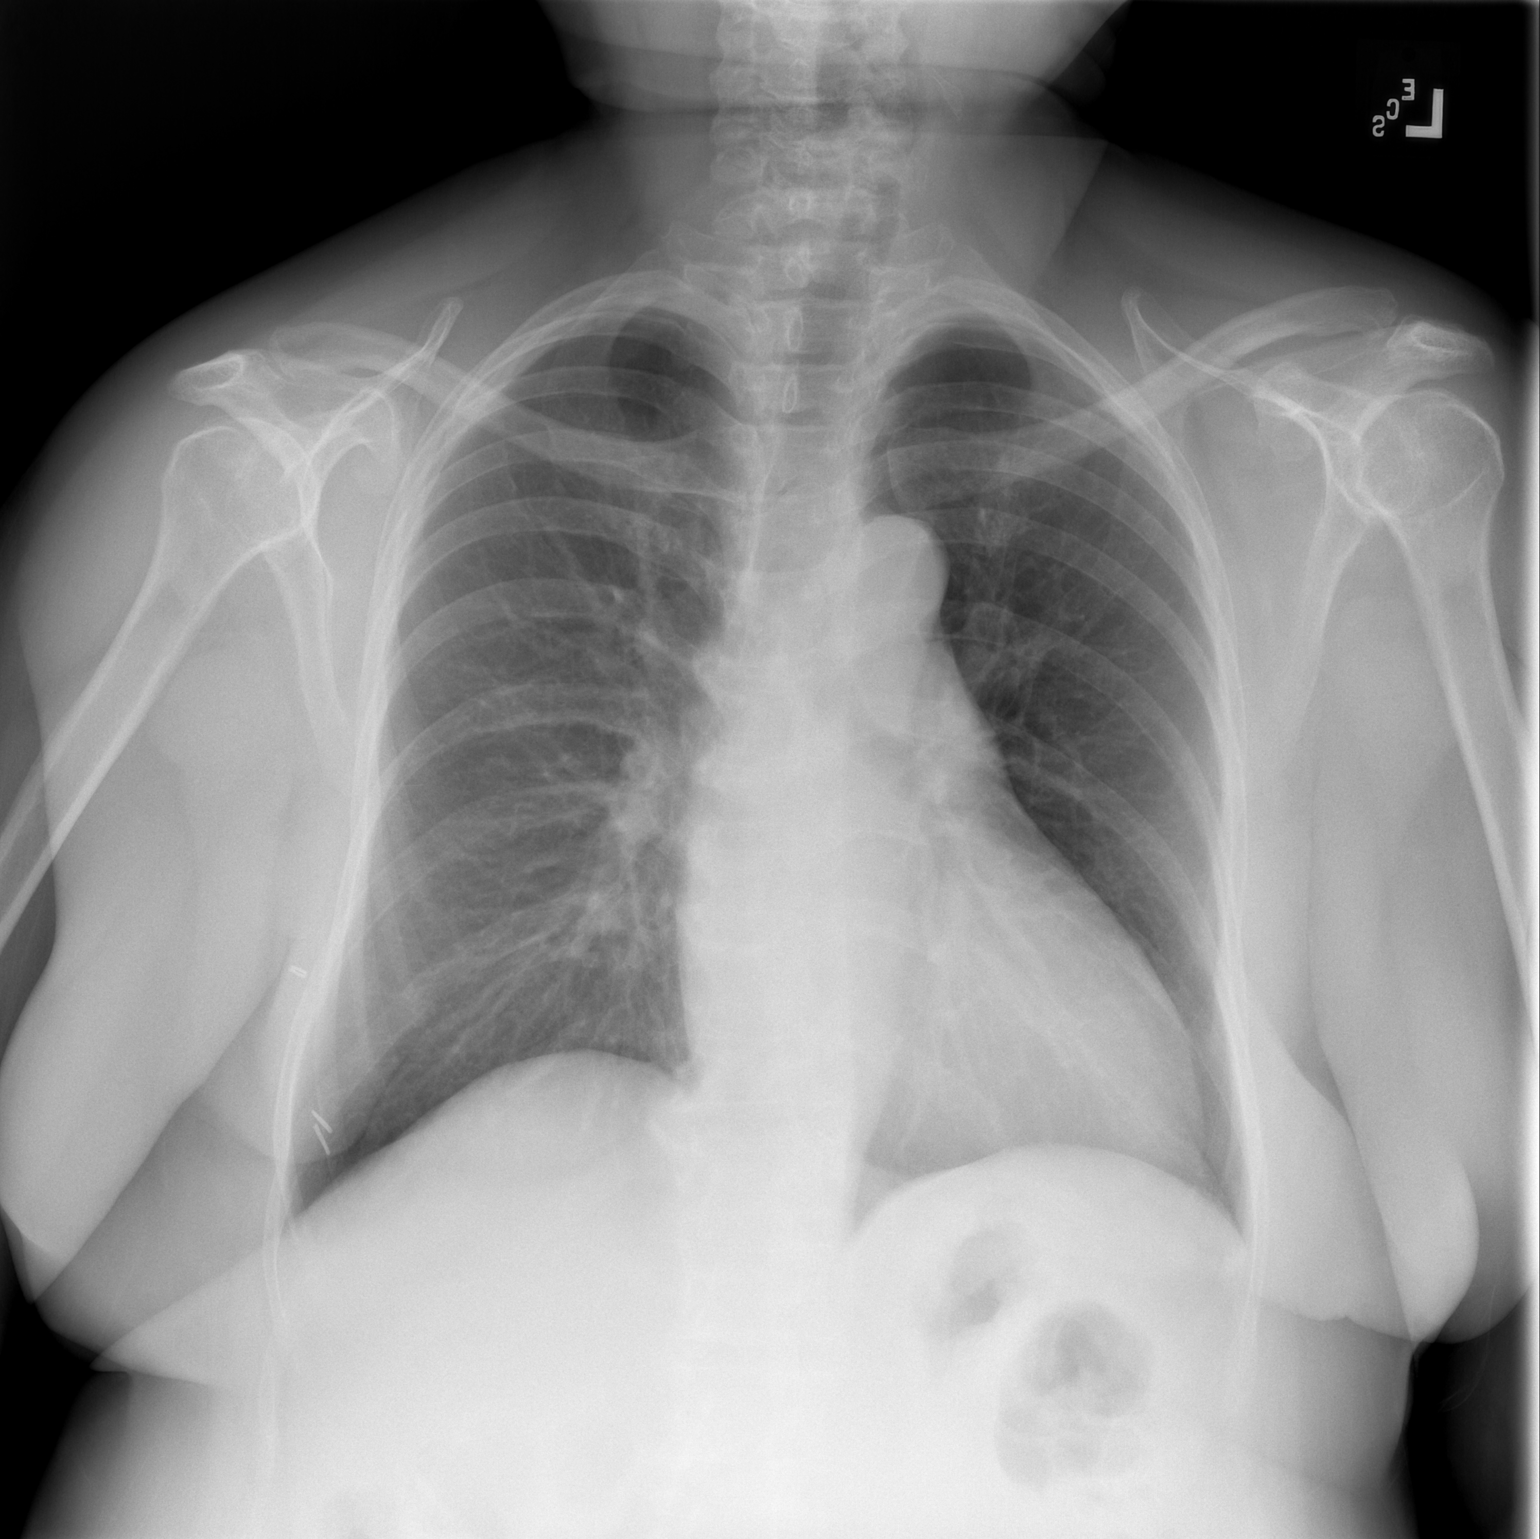

[w chest lat]
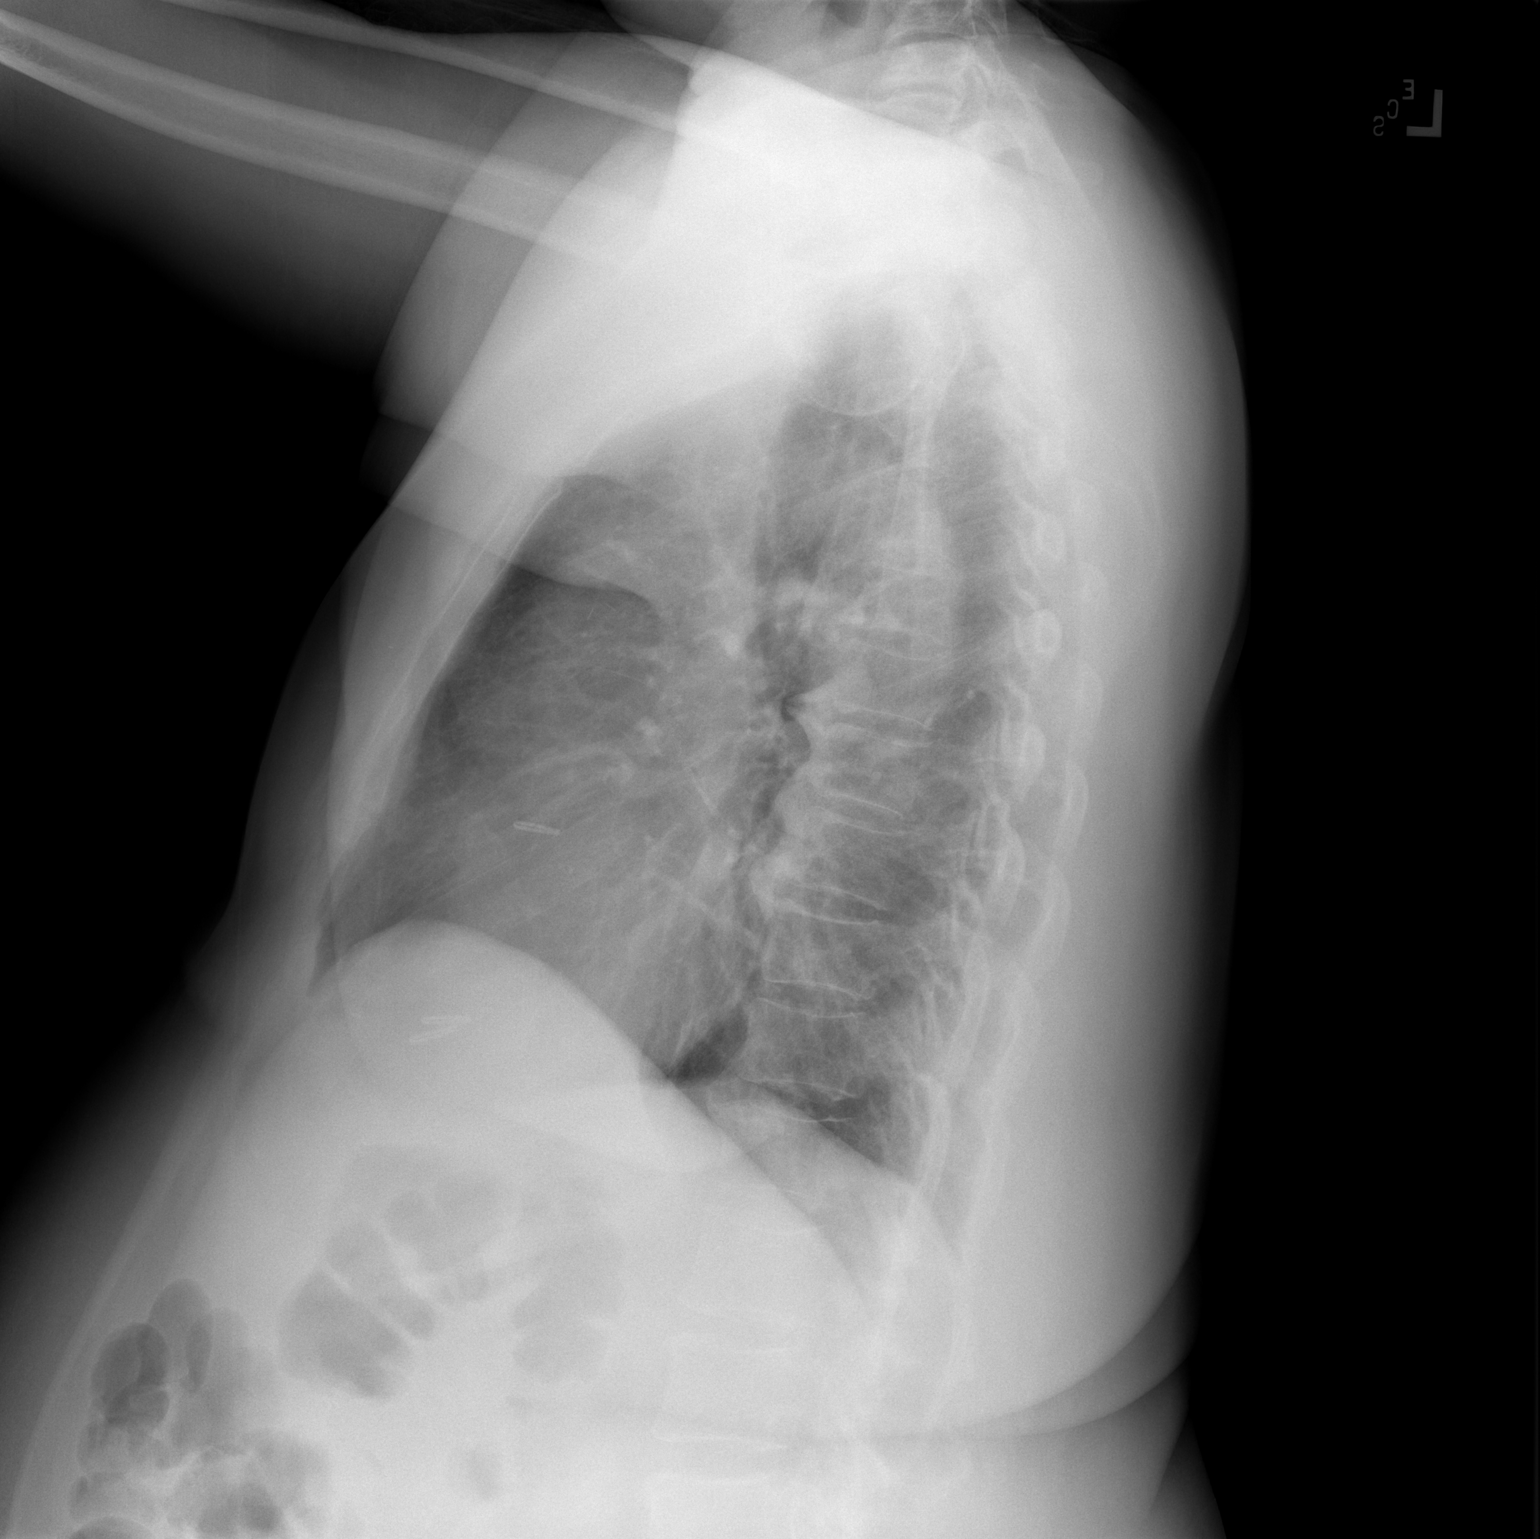

[2 of 2 positions shown; findings below may reference images not displayed]

FINDINGS: Heart and mediastinal contours are within normal limits. No focal
opacities or effusions. No acute bony abnormality.
IMPRESSION: No active cardiopulmonary disease.

## 2023-11-17 ENCOUNTER — Emergency Department (HOSPITAL_BASED_OUTPATIENT_CLINIC_OR_DEPARTMENT_OTHER)

## 2023-11-17 ENCOUNTER — Other Ambulatory Visit: Payer: Self-pay

## 2023-11-17 ENCOUNTER — Encounter (HOSPITAL_BASED_OUTPATIENT_CLINIC_OR_DEPARTMENT_OTHER): Payer: Self-pay | Admitting: Emergency Medicine

## 2023-11-17 ENCOUNTER — Emergency Department (HOSPITAL_BASED_OUTPATIENT_CLINIC_OR_DEPARTMENT_OTHER)
Admission: EM | Admit: 2023-11-17 | Discharge: 2023-11-17 | Disposition: A | Discharge status: Home / Self Care | Attending: Emergency Medicine | Admitting: Emergency Medicine

## 2023-11-17 DIAGNOSIS — N3001 Acute cystitis with hematuria: Secondary | ICD-10-CM | POA: Diagnosis not present

## 2023-11-17 DIAGNOSIS — Z79899 Other long term (current) drug therapy: Secondary | ICD-10-CM | POA: Diagnosis not present

## 2023-11-17 DIAGNOSIS — R350 Frequency of micturition: Secondary | ICD-10-CM | POA: Diagnosis present

## 2023-11-17 DIAGNOSIS — Z7984 Long term (current) use of oral hypoglycemic drugs: Secondary | ICD-10-CM | POA: Diagnosis not present

## 2023-11-17 DIAGNOSIS — E039 Hypothyroidism, unspecified: Secondary | ICD-10-CM | POA: Insufficient documentation

## 2023-11-17 DIAGNOSIS — E119 Type 2 diabetes mellitus without complications: Secondary | ICD-10-CM | POA: Insufficient documentation

## 2023-11-17 DIAGNOSIS — I10 Essential (primary) hypertension: Secondary | ICD-10-CM | POA: Insufficient documentation

## 2023-11-17 LAB — URINALYSIS, ROUTINE W REFLEX MICROSCOPIC
Bilirubin Urine: NEGATIVE
Glucose, UA: NEGATIVE mg/dL
Ketones, ur: NEGATIVE mg/dL
Nitrite: NEGATIVE
Protein, ur: 30 mg/dL — AB
Specific Gravity, Urine: 1.02 (ref 1.005–1.030)
pH: 6.5 (ref 5.0–8.0)

## 2023-11-17 LAB — CBC WITH DIFFERENTIAL/PLATELET
Abs Immature Granulocytes: 0.04 10*3/uL (ref 0.00–0.07)
Basophils Absolute: 0 10*3/uL (ref 0.0–0.1)
Basophils Relative: 0 %
Eosinophils Absolute: 0.2 10*3/uL (ref 0.0–0.5)
Eosinophils Relative: 2 %
HCT: 39.2 % (ref 36.0–46.0)
Hemoglobin: 12.7 g/dL (ref 12.0–15.0)
Immature Granulocytes: 0 %
Lymphocytes Relative: 17 %
Lymphs Abs: 1.7 10*3/uL (ref 0.7–4.0)
MCH: 28.3 pg (ref 26.0–34.0)
MCHC: 32.4 g/dL (ref 30.0–36.0)
MCV: 87.3 fL (ref 80.0–100.0)
Monocytes Absolute: 0.6 10*3/uL (ref 0.1–1.0)
Monocytes Relative: 6 %
Neutro Abs: 7.7 10*3/uL (ref 1.7–7.7)
Neutrophils Relative %: 75 %
Platelets: 267 10*3/uL (ref 150–400)
RBC: 4.49 MIL/uL (ref 3.87–5.11)
RDW: 13.9 % (ref 11.5–15.5)
WBC: 10.3 10*3/uL (ref 4.0–10.5)
nRBC: 0 % (ref 0.0–0.2)

## 2023-11-17 LAB — COMPREHENSIVE METABOLIC PANEL WITH GFR
ALT: 11 U/L (ref 0–44)
AST: 17 U/L (ref 15–41)
Albumin: 3.9 g/dL (ref 3.5–5.0)
Alkaline Phosphatase: 104 U/L (ref 38–126)
Anion gap: 8 (ref 5–15)
BUN: 22 mg/dL (ref 8–23)
CO2: 27 mmol/L (ref 22–32)
Calcium: 9.8 mg/dL (ref 8.9–10.3)
Chloride: 106 mmol/L (ref 98–111)
Creatinine, Ser: 0.97 mg/dL (ref 0.44–1.00)
GFR, Estimated: >60 mL/min (ref >60)
Glucose, Bld: 124 mg/dL — ABNORMAL HIGH (ref 70–99)
Potassium: 4.2 mmol/L (ref 3.5–5.1)
Sodium: 141 mmol/L (ref 135–145)
Total Bilirubin: 0.6 mg/dL (ref 0.0–1.2)
Total Protein: 7.9 g/dL (ref 6.5–8.1)

## 2023-11-17 LAB — URINALYSIS, MICROSCOPIC (REFLEX)
RBC / HPF: >50 RBC/hpf (ref 0–5)
WBC, UA: >50 WBC/hpf (ref 0–5)

## 2023-11-17 MED ORDER — CEFADROXIL 500 MG PO CAPS: 500.0000 mg | ORAL_CAPSULE | Freq: Two times a day (BID) | ORAL | 0 refills | Status: AC

## 2023-11-17 MED ORDER — IOHEXOL 300 MG/ML  SOLN
100.0000 mL | Freq: Once | INTRAMUSCULAR | Status: AC | PRN
  Administered 2023-11-17: 80 mL via INTRAVENOUS

## 2023-11-17 MED ORDER — SODIUM CHLORIDE 0.9 % IV SOLN
1.0000 g | Freq: Once | INTRAVENOUS | Status: AC
  Administered 2023-11-17: 1 g via INTRAVENOUS
  Filled 2023-11-17: qty 10

## 2023-11-17 NOTE — Discharge Instructions (Signed)
 As discussed, your workup today was overall reassuring.  Your urine did appear to be infected.  Will place on antibiotics for this.  CT scan of your abdomen appeared normal.  Will recommend making appoint with your OB/GYN if you notice vaginal bleeding beginning again.  Please do not hesitate to return if the worrisome signs and symptoms we discussed to become apparent.

## 2023-11-17 NOTE — ED Provider Notes (Signed)
 [facility] EMERGENCY DEPARTMENT AT [facility] Provider Note   CSN: [id number] Arrival date & time: 11/17/23  1158     History  Chief Complaint  Patient presents with   Vaginal Bleeding    Julia Sexton is a 68 y.o. female.   Vaginal Bleeding   68 year old female presents to the emergency department with complaints of spotting, urinary frequency.  Reports symptoms getting this morning.  Denies any abdominal pain, nausea, vomiting, change in bowel habits.  Patient states that she would go to urinate when she noticed upon wiping that her toilet paper was pink.  Did note 1 episode of very small amounts of bright red blood.  States that she thinks it is vaginal bleeding but is not sure.  Does state that last week, was having left flank pain of which she was told she had constipation and was prescribed Linzess with improvement.  Currently denying any flank/abdominal pain.  Past medical history seen for diabetes mellitus, hypertension, cancer, hypothyroidism, hysterectomy with bilateral salpingo-oophorectomy  Home Medications Prior to Admission medications   Medication Sig Start Date End Date Taking? Authorizing Provider  cefadroxil (DURICEF) 500 MG capsule Take 1 capsule (500 mg total) by mouth 2 (two) times daily. 11/18/23  Yes Sherian Maroon A, PA  amLODipine (NORVASC) 10 MG tablet Take 1 tablet by mouth daily. 09/14/20   [provider]  glipiZIDE (GLUCOTROL) 10 MG tablet Take 10 mg by mouth daily.     [provider]  hydrochlorothiazide (HYDRODIURIL) 25 MG tablet Take 25 mg by mouth daily.    [provider]  lisinopril (PRINIVIL,ZESTRIL) 40 MG tablet Take 40 mg by mouth daily.    [provider]  metFORMIN (GLUCOPHAGE) 500 MG tablet Take 1,000 mg by mouth 2 (two) times daily with a meal.     [provider]  metoprolol succinate (TOPROL-XL) 50 MG 24 hr tablet Take 200 mg by mouth daily. Take with or immediately following a meal.     [provider]      Allergies    Patient has no known allergies.    Review of Systems   Review of Systems  Genitourinary:  Positive for vaginal bleeding.  All other systems reviewed and are negative.   Physical Exam Updated Vital Signs BP (!) 152/76   Pulse 75   Temp 98.4 F (36.9 C)   Resp 18   Ht 5\' 3"  (1.6 m)   Wt 82.6 kg   SpO2 98%   BMI 32.24 kg/m  Physical Exam Vitals and nursing note reviewed. Exam conducted with a chaperone present.  Constitutional:      General: She is not in acute distress.    Appearance: She is well-developed.  HENT:     Head: Normocephalic and atraumatic.  Eyes:     Conjunctiva/sclera: Conjunctivae normal.  Cardiovascular:     Rate and Rhythm: Normal rate and regular rhythm.  Pulmonary:     Effort: Pulmonary effort is normal. No respiratory distress.     Breath sounds: Normal breath sounds. No wheezing, rhonchi or rales.  Abdominal:     Palpations: Abdomen is soft.     Tenderness: There is no abdominal tenderness.  Genitourinary:    Comments: Pelvic exam performed with female nursing staff at bedside. Speculum exam showed no evidence of vaginal bleeding or abnormal discharge.  Musculoskeletal:        General: No swelling.     Cervical back: Neck supple.  Skin:    General:  Skin is warm and dry.     Capillary Refill: Capillary refill takes less than 2 seconds.  Neurological:     Mental Status: She is alert.  Psychiatric:        Mood and Affect: Mood normal.     ED Results / Procedures / Treatments   Labs (all labs ordered are listed, but only abnormal results are displayed) Labs Reviewed  URINALYSIS, ROUTINE W REFLEX MICROSCOPIC - Abnormal; Notable for the following components:      Result Value   APPearance CLOUDY (*)    Hgb urine dipstick LARGE (*)    Protein, ur 30 (*)    Leukocytes,Ua LARGE (*)    All other components within normal limits  COMPREHENSIVE METABOLIC PANEL WITH GFR - Abnormal; Notable for the  following components:   Glucose, Bld 124 (*)    All other components within normal limits  URINALYSIS, MICROSCOPIC (REFLEX) - Abnormal; Notable for the following components:   Bacteria, UA MANY (*)    All other components within normal limits  URINE CULTURE  CBC WITH DIFFERENTIAL/PLATELET    EKG None  Radiology CT ABDOMEN PELVIS W CONTRAST Result Date: 11/17/2023 CLINICAL DATA:  Abdominal pain, acute, nonlocalized EXAM: CT ABDOMEN AND PELVIS WITH CONTRAST TECHNIQUE: Multidetector CT imaging of the abdomen and pelvis was performed using the standard protocol following bolus administration of intravenous contrast. RADIATION DOSE REDUCTION: This exam was performed according to the departmental dose-optimization program which includes automated exposure control, adjustment of the mA and/or kV according to patient size and/or use of iterative reconstruction technique. CONTRAST:  80mL OMNIPAQUE IOHEXOL 300 MG/ML  SOLN COMPARISON:  None Available. FINDINGS: Lower chest: No acute abnormality Hepatobiliary: No focal hepatic abnormality. Gallbladder unremarkable. Pancreas: No focal abnormality or ductal dilatation. Spleen: No focal abnormality.  Normal size. Adrenals/Urinary Tract: Small cyst in the midpole of the right kidney for which no additional follow-up imaging is recommended. Adrenal glands normal. No stones or hydronephrosis. Urinary bladder unremarkable. Stomach/Bowel: Normal appendix. Few scattered sigmoid diverticula. No active diverticulitis. Stomach and small bowel decompressed. No bowel obstruction or inflammatory process. Vascular/Lymphatic: No evidence of aneurysm or adenopathy. Reproductive: Prior hysterectomy.  No adnexal masses. Other: No free fluid or free air. Musculoskeletal: No acute bony abnormality. IMPRESSION: No acute findings in the abdomen or pelvis. Scattered sigmoid diverticula.  No active diverticulitis. Electronically Signed   By: Janeece Mechanic M.D.   On: 11/17/2023 18:39     Procedures Procedures    Medications Ordered in ED Medications  iohexol (OMNIPAQUE) 300 MG/ML solution 100 mL (80 mLs Intravenous Contrast Given 11/17/23 1656)  cefTRIAXone (ROCEPHIN) 1 g in sodium chloride 0.9 % 100 mL IVPB (0 g Intravenous Stopped 11/17/23 1825)    ED Course/ Medical Decision Making/ A&P                                 Medical Decision Making Amount and/or Complexity of Data Reviewed Labs: ordered. Radiology: ordered.  Risk Prescription drug management.   This patient presents to the ED for concern of bleeding, this involves an extensive number of treatment options, and is a complaint that carries with it a high risk of complications and morbidity.  The differential diagnosis includes UTI, pyelonephritis, nephrolithiasis, malignancy, fibroid, lesion, other   Co morbidities that complicate the patient evaluation  See HPI   Additional history obtained:  Additional history obtained from EMR External records from outside source obtained and  reviewed including hospital records   Lab Tests:  I Ordered, and personally interpreted labs.  The pertinent results include: No leukocytosis.  No evidence of anemia.  Platelets within range.  No Electra abnormalities.  No transaminitis.  No renal dysfunction.  UA with many bacteria, greater than 50 RBCs and WBCs.  Large leukocytes.  Urine culture pending.   Imaging Studies ordered:  I ordered imaging studies including CT abdomen pelvis I independently visualized and interpreted imaging which showed no acute intra-abdominal abnormality.  Diverticulosis. I agree with the radiologist interpretation   Cardiac Monitoring: / EKG:  The patient was maintained on a cardiac monitor.  I personally viewed and interpreted the cardiac monitored which showed an underlying rhythm of: Sinus rhythm   Consultations Obtained:  N/a   Problem List / ED Course / Critical interventions / Medication management  UTI I  ordered medication including Rocephin  Reevaluation of the patient after these medicines showed that the patient stayed the same I have reviewed the patients home medicines and have made adjustments as needed   Social Determinants of Health:  Denies tobacco, licit drug use.   Test / Admission - Considered:  UTI Vitals signs significant for hypertension and blood pressure 141/74. Otherwise within normal range and stable throughout visit. Laboratory/imaging studies significant for: See above 68 year old female presents to the emergency department with complaints of spotting, urinary frequency.  Reports symptoms getting this morning.  Denies any abdominal pain, nausea, vomiting, change in bowel habits.  Patient states that she would go to urinate when she noticed upon wiping that her toilet paper was pink.  Did note 1 episode of very small amounts of bright red blood.  States that she thinks it is vaginal bleeding but is not sure.  Does state that last week, was having left flank pain of which she was told she had constipation and was prescribed Linzess with improvement.  Currently denying any flank/abdominal pain. On exam, no abdominal or CVA tenderness.  Pelvic exam showed no active vaginal bleeding or appreciable lesion.  Laboratory studies concerning for UA with evidence of infection especially with patient's symptoms of urinary frequency with leukocyte positive, many bacteria, WBC and RBC greater than 50.  CT imaging was obtained given patient's history of flank pain over the weekend with current urinary symptoms and questionable vaginal bleeding versus hematuria which was negative for any acute intra-abdominal/pelvic abnormality.  Regarding bleeding, no appreciable vaginal bleeding clinically on exam.  Patient states that this seems to have stopped ever since coming to the emergency department and she is urinated twice while being here without evidence of bright red blood or pink appearing toilet  bowl water.  Suspect that patient at least has a UTI with concern for possible hematuria given reassuring clinical findings from a vaginal perspective.  Will place patient on empiric antibiotics and culture urine.  Will recommend follow-up with PCP as well as OB/GYN in the outpatient setting.  Treatment plan discussed with patient and she nausea her standing was agreeable to set plan.  Patient overall well-appearing, afebrile in no acute distress. Worrisome signs and symptoms were discussed with the patient, and the patient acknowledged understanding to return to the ED if noticed. Patient was stable upon discharge.          Final Clinical Impression(s) / ED Diagnoses Final diagnoses:  Acute cystitis with hematuria    Rx / DC Orders ED Discharge Orders          Ordered    cefadroxil (  DURICEF) 500 MG capsule  2 times daily        11/17/23 1859              [place] Butter, [place] 11/17/23 1912    Ninetta Basket, MD 11/18/23 [phone]

## 2023-11-17 NOTE — ED Notes (Signed)
 Unable to access Labs Difficult stick

## 2023-11-17 NOTE — ED Triage Notes (Signed)
 Pt POV c/o intermittent 'spotting' since last night,  Denies dizziness.   Reports groin pain on Saturday, dx with constipation. Been taking laxative since Saturday, with relief. Dx with hemorrhoids last year.

## 2023-11-19 LAB — URINE CULTURE: Culture: >=100000 — AB

## 2023-11-20 ENCOUNTER — Telehealth (HOSPITAL_BASED_OUTPATIENT_CLINIC_OR_DEPARTMENT_OTHER): Payer: Self-pay | Admitting: Emergency Medicine

## 2023-11-20 NOTE — Telephone Encounter (Signed)
 Post ED Visit - Positive Culture Follow-up  Culture report reviewed by antimicrobial stewardship pharmacist: [facility] []  Rankin Dee, Pharm.D. []  Venetia Gully, [address].D., BCPS AQ-ID []  Garrel Crews, Pharm.D., BCPS []  Almarie Lunger, Pharm.D., BCPS []  [place], [address].D., BCPS, AAHIVP []  Rosaline Bihari, Pharm.D., BCPS, AAHIVP [x]  Vernell Meier, PharmD, BCPS []  Latanya Hint, PharmD, BCPS []  Donald Medley, PharmD, BCPS []  Rocky Bold, PharmD []  Dorothyann Alert, PharmD, BCPS []  Morene Babe, PharmD  [facility] []  Rosaline Edison, PharmD []  Romona Bliss, PharmD []  Dolphus Roller, PharmD []  Veva Seip, Rph []  Vernell Daunt) Leonce, PharmD []  Eva Allis, PharmD []  Rosaline Millet, PharmD []  Iantha Batch, PharmD []  Arvin Gauss, PharmD []  Wanda Hasting, PharmD []  Ronal Rav, PharmD []  Rocky Slade, PharmD []  Bard Jeans, PharmD   Positive urine culture Treated with Cefadroxil , organism sensitive to the same and no further patient follow-up is required at this time.  Clotilda BROCKS Jamonica Schoff 11/20/2023, 2:25 PM
